# Patient Record
Sex: Female | Born: 1935 | Race: White | Hispanic: No | Smoking: Never smoker
Health system: Southern US, Community
[De-identification: ages and names within clinical notes are randomized; demographics above are authoritative.]

## PROBLEM LIST (undated history)

## (undated) DIAGNOSIS — I10 Essential (primary) hypertension: Secondary | ICD-10-CM

## (undated) HISTORY — PX: CHOLECYSTECTOMY: SHX55

---

## 2012-07-05 DIAGNOSIS — H2510 Age-related nuclear cataract, unspecified eye: Secondary | ICD-10-CM | POA: Diagnosis not present

## 2012-07-05 DIAGNOSIS — H5200 Hypermetropia, unspecified eye: Secondary | ICD-10-CM | POA: Diagnosis not present

## 2012-07-05 DIAGNOSIS — H524 Presbyopia: Secondary | ICD-10-CM | POA: Diagnosis not present

## 2012-07-05 DIAGNOSIS — H52229 Regular astigmatism, unspecified eye: Secondary | ICD-10-CM | POA: Diagnosis not present

## 2012-09-06 DIAGNOSIS — H2510 Age-related nuclear cataract, unspecified eye: Secondary | ICD-10-CM | POA: Diagnosis not present

## 2012-09-29 DIAGNOSIS — Z23 Encounter for immunization: Secondary | ICD-10-CM | POA: Diagnosis not present

## 2012-10-17 DIAGNOSIS — H2510 Age-related nuclear cataract, unspecified eye: Secondary | ICD-10-CM | POA: Diagnosis not present

## 2012-10-17 DIAGNOSIS — H269 Unspecified cataract: Secondary | ICD-10-CM | POA: Diagnosis not present

## 2012-10-18 DIAGNOSIS — H2510 Age-related nuclear cataract, unspecified eye: Secondary | ICD-10-CM | POA: Diagnosis not present

## 2012-11-07 DIAGNOSIS — H269 Unspecified cataract: Secondary | ICD-10-CM | POA: Diagnosis not present

## 2012-11-07 DIAGNOSIS — H524 Presbyopia: Secondary | ICD-10-CM | POA: Diagnosis not present

## 2012-11-07 DIAGNOSIS — H2510 Age-related nuclear cataract, unspecified eye: Secondary | ICD-10-CM | POA: Diagnosis not present

## 2012-11-28 DIAGNOSIS — Z1231 Encounter for screening mammogram for malignant neoplasm of breast: Secondary | ICD-10-CM | POA: Diagnosis not present

## 2012-12-22 DIAGNOSIS — I10 Essential (primary) hypertension: Secondary | ICD-10-CM | POA: Diagnosis not present

## 2012-12-22 DIAGNOSIS — E7800 Pure hypercholesterolemia, unspecified: Secondary | ICD-10-CM | POA: Diagnosis not present

## 2013-02-01 DIAGNOSIS — D1800 Hemangioma unspecified site: Secondary | ICD-10-CM | POA: Diagnosis not present

## 2013-02-01 DIAGNOSIS — L608 Other nail disorders: Secondary | ICD-10-CM | POA: Diagnosis not present

## 2013-02-01 DIAGNOSIS — D485 Neoplasm of uncertain behavior of skin: Secondary | ICD-10-CM | POA: Diagnosis not present

## 2013-02-01 DIAGNOSIS — L820 Inflamed seborrheic keratosis: Secondary | ICD-10-CM | POA: Diagnosis not present

## 2013-02-01 DIAGNOSIS — C44319 Basal cell carcinoma of skin of other parts of face: Secondary | ICD-10-CM | POA: Diagnosis not present

## 2013-02-23 DIAGNOSIS — C44319 Basal cell carcinoma of skin of other parts of face: Secondary | ICD-10-CM | POA: Diagnosis not present

## 2013-06-01 DIAGNOSIS — E7800 Pure hypercholesterolemia, unspecified: Secondary | ICD-10-CM | POA: Diagnosis not present

## 2013-06-01 DIAGNOSIS — I10 Essential (primary) hypertension: Secondary | ICD-10-CM | POA: Diagnosis not present

## 2013-08-23 DIAGNOSIS — Z85828 Personal history of other malignant neoplasm of skin: Secondary | ICD-10-CM | POA: Diagnosis not present

## 2013-08-23 DIAGNOSIS — L570 Actinic keratosis: Secondary | ICD-10-CM | POA: Diagnosis not present

## 2013-10-12 DIAGNOSIS — Z23 Encounter for immunization: Secondary | ICD-10-CM | POA: Diagnosis not present

## 2013-12-01 DIAGNOSIS — I10 Essential (primary) hypertension: Secondary | ICD-10-CM | POA: Diagnosis not present

## 2013-12-01 DIAGNOSIS — E7800 Pure hypercholesterolemia, unspecified: Secondary | ICD-10-CM | POA: Diagnosis not present

## 2013-12-08 DIAGNOSIS — H6120 Impacted cerumen, unspecified ear: Secondary | ICD-10-CM | POA: Diagnosis not present

## 2013-12-08 DIAGNOSIS — Z0000 Encounter for general adult medical examination without abnormal findings: Secondary | ICD-10-CM | POA: Diagnosis not present

## 2013-12-08 DIAGNOSIS — Z23 Encounter for immunization: Secondary | ICD-10-CM | POA: Diagnosis not present

## 2013-12-08 DIAGNOSIS — Z1331 Encounter for screening for depression: Secondary | ICD-10-CM | POA: Diagnosis not present

## 2013-12-08 DIAGNOSIS — E7800 Pure hypercholesterolemia, unspecified: Secondary | ICD-10-CM | POA: Diagnosis not present

## 2013-12-08 DIAGNOSIS — I10 Essential (primary) hypertension: Secondary | ICD-10-CM | POA: Diagnosis not present

## 2014-02-21 DIAGNOSIS — D2330 Other benign neoplasm of skin of unspecified part of face: Secondary | ICD-10-CM | POA: Diagnosis not present

## 2014-02-21 DIAGNOSIS — Z85828 Personal history of other malignant neoplasm of skin: Secondary | ICD-10-CM | POA: Diagnosis not present

## 2014-02-21 DIAGNOSIS — L570 Actinic keratosis: Secondary | ICD-10-CM | POA: Diagnosis not present

## 2014-02-21 DIAGNOSIS — L678 Other hair color and hair shaft abnormalities: Secondary | ICD-10-CM | POA: Diagnosis not present

## 2014-02-21 DIAGNOSIS — L738 Other specified follicular disorders: Secondary | ICD-10-CM | POA: Diagnosis not present

## 2014-02-21 DIAGNOSIS — D485 Neoplasm of uncertain behavior of skin: Secondary | ICD-10-CM | POA: Diagnosis not present

## 2014-03-28 ENCOUNTER — Emergency Department (HOSPITAL_COMMUNITY): Payer: Medicare Other

## 2014-03-28 ENCOUNTER — Emergency Department (HOSPITAL_COMMUNITY)
Admission: EM | Admit: 2014-03-28 | Discharge: 2014-03-28 | Disposition: A | Discharge status: Home / Self Care | Payer: Medicare Other | Attending: Emergency Medicine | Admitting: Emergency Medicine

## 2014-03-28 ENCOUNTER — Encounter (HOSPITAL_COMMUNITY): Payer: Self-pay | Admitting: Emergency Medicine

## 2014-03-28 DIAGNOSIS — S52509A Unspecified fracture of the lower end of unspecified radius, initial encounter for closed fracture: Secondary | ICD-10-CM | POA: Insufficient documentation

## 2014-03-28 DIAGNOSIS — S52599A Other fractures of lower end of unspecified radius, initial encounter for closed fracture: Secondary | ICD-10-CM | POA: Diagnosis not present

## 2014-03-28 DIAGNOSIS — Z91040 Latex allergy status: Secondary | ICD-10-CM | POA: Diagnosis not present

## 2014-03-28 DIAGNOSIS — S52609A Unspecified fracture of lower end of unspecified ulna, initial encounter for closed fracture: Secondary | ICD-10-CM | POA: Diagnosis not present

## 2014-03-28 DIAGNOSIS — Z79899 Other long term (current) drug therapy: Secondary | ICD-10-CM | POA: Insufficient documentation

## 2014-03-28 DIAGNOSIS — I10 Essential (primary) hypertension: Secondary | ICD-10-CM | POA: Diagnosis not present

## 2014-03-28 DIAGNOSIS — Z880 Allergy status to penicillin: Secondary | ICD-10-CM | POA: Diagnosis not present

## 2014-03-28 DIAGNOSIS — R296 Repeated falls: Secondary | ICD-10-CM | POA: Insufficient documentation

## 2014-03-28 DIAGNOSIS — Y9301 Activity, walking, marching and hiking: Secondary | ICD-10-CM | POA: Insufficient documentation

## 2014-03-28 DIAGNOSIS — Y92009 Unspecified place in unspecified non-institutional (private) residence as the place of occurrence of the external cause: Secondary | ICD-10-CM | POA: Insufficient documentation

## 2014-03-28 DIAGNOSIS — S62102A Fracture of unspecified carpal bone, left wrist, initial encounter for closed fracture: Secondary | ICD-10-CM

## 2014-03-28 HISTORY — DX: Essential (primary) hypertension: I10

## 2014-03-28 MED ORDER — TRAMADOL HCL 50 MG PO TABS: 50.0000 mg | ORAL_TABLET | Freq: Four times a day (QID) | ORAL | Status: AC | PRN

## 2014-03-28 NOTE — ED Notes (Signed)
Pt tripped and fell while doing yard work. Pain to left wrist with slight deformity. Occurred just PTA.

## 2014-03-28 NOTE — Discharge Instructions (Signed)
Call Dr. Bertis Ruddy office in the morning and tell them you were evaluated here in the ED for a wrist fracture and will need surgery. Tell them that we spoke with Dr. Burney Gauze and you were to call for an appointment time.   DO NOT DRIVE IF YOU ARE TAKING THE PAIN MEDICATION

## 2014-03-28 NOTE — ED Provider Notes (Signed)
Medical screening examination/treatment/procedure(s) were conducted as a shared visit with non-physician practitioner(s) and myself.  I personally evaluated the patient during the encounter.  X-rays reviewed with patient and family.   Babette Relic, MD 03/30/14 [id number]

## 2014-03-28 NOTE — ED Provider Notes (Signed)
CSN: [id number]     Arrival date & time 03/28/14  1815 History   None    Chief Complaint  Patient presents with  . Wrist Injury     (Consider location/radiation/quality/duration/timing/severity/associated sxs/prior Treatment) Patient is a 80 y.o. female presenting with wrist injury. The history is provided by the patient.  Wrist Injury Location:  Wrist Time since incident:  1 hour Injury: yes   Mechanism of injury: fall   Fall:    Fall occurred:  Walking   Impact surface:  Paediatric nurse of impact:  Hands Wrist location:  L wrist Pain details:    Quality:  Throbbing   Radiates to:  Does not radiate   Severity:  Moderate   Onset quality:  Sudden   Timing:  Constant   Progression:  Worsening Chronicity:  New Handedness:  Right-handed Dislocation: no   Foreign body present:  No foreign bodies Prior injury to area:  No Relieved by:  None tried Worsened by:  Movement Ineffective treatments:  None tried Associated symptoms: decreased range of motion and swelling   Associated symptoms: no numbness and no tingling    Karen Small is a 80 y.o. female who presents to the ED with pain and swelling of the left wrist s/p fall in her yard. She was putting down pine needles and had finished when she tripped and fell. Her weight went on her left hand. She denies any other injuries.   Past Medical History  Diagnosis Date  . Hypertension    Past Surgical History  Procedure Laterality Date  . Cholecystectomy     No family history on file. History  Substance Use Topics  . Smoking status: Never Smoker   . Smokeless tobacco: Not on file  . Alcohol Use: No   OB History   Grav Para Term Preterm Abortions TAB SAB Ect Mult Living                 Review of Systems Negative except as stated in HPI   Allergies  Latex; Neosporin; Niacin and related; Nickel; and Penicillins  Home Medications   Current Outpatient Rx  Name  Route  Sig  Dispense  Refill  . chlorthalidone  (HYGROTON) 25 MG tablet   Oral   Take 25 mg by mouth daily.         Marland Kitchen PRESCRIPTION MEDICATION   Oral   Take 1 tablet by mouth daily. Cholesterol medications (STATIN)          BP 151/90  Pulse 77  Temp(Src) 98.5 F (36.9 C) (Oral)  Resp 20  Ht 5\' 4"  (1.626 m)  Wt 152 lb (68.947 kg)  BMI 26.08 kg/m2  SpO2 100% Physical Exam  Nursing note and vitals reviewed. Constitutional: She is oriented to person, place, and time. She appears well-developed and well-nourished. No distress.  Very active female who appears much younger than stated age.   HENT:  Head: Normocephalic and atraumatic.  Eyes: Conjunctivae and EOM are normal.  Neck: Normal range of motion. Neck supple.  Cardiovascular: Normal rate.   Pulmonary/Chest: Effort normal.  Musculoskeletal:       Left wrist: She exhibits decreased range of motion, tenderness, bony tenderness, swelling and deformity. She exhibits no laceration.  Radial pulse strong, adequate circulation, good touch sensation.   Neurological: She is alert and oriented to person, place, and time. No cranial nerve deficit.  Skin: Skin is warm and dry.  Psychiatric: She has a normal mood and affect. Her  behavior is normal.    ED Course: X-ray reviewed with Dr. Stevie Kern and he examined the patient.  Consult with Dr. Burney Gauze and he will see the patient in the office tomorrow.   Procedures (including critical care time) Labs Review Labs Reviewed - No data to display Imaging Review Dg Wrist Complete Left  03/28/2014   CLINICAL DATA:  Fall, left wrist pain and swelling  EXAM: LEFT WRIST - COMPLETE 3+ VIEW  COMPARISON:  None.  FINDINGS: Comminuted impacted distal radius fracture. Mildly displaced fracture through the ulnar styloid. 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 The scaphoid appears intact. The carpus is congruent.  IMPRESSION: 1. Comminuted and impacted distal radius fracture with articular involvement. 2. Mildly displaced fracture  through the ulnar styloid.   Electronically Signed   By: Jacqulynn Cadet M.D.   On: 03/28/2014 19:31   MDM  80 y.o. female with pain, swelling and mild deformity to the left wrist s/p fall. Placed in sugar tong splint, ice, elevation, sling and follow up with Dr. Burney Gauze tomorrow. Stable for discharge and remains neurovascularly intact. I have reviewed this patient's vital signs, nurses notes, appropriate labs and imaging.  I have discussed findings and plan of care with the patient and she voices understanding. Discussed pain management and patient states she has had Percocet in the past and it was to strong and she wants something mild. States she takes ibuprofen for pain. Will give Rx for Tramadol so if ibuprofen is not enough.   Medication List    TAKE these medications       traMADol 50 MG tablet  Commonly known as:  ULTRAM  Take 1 tablet (50 mg total) by mouth every 6 (six) hours as needed.      ASK your doctor about these medications       chlorthalidone 25 MG tablet  Commonly known as:  HYGROTON  Take 25 mg by mouth daily.     PRESCRIPTION MEDICATION  Take 1 tablet by mouth daily. Cholesterol medications (STATIN)           Ashley Murrain, NP 03/28/14 2029

## 2014-03-29 DIAGNOSIS — S52599A Other fractures of lower end of unspecified radius, initial encounter for closed fracture: Secondary | ICD-10-CM | POA: Diagnosis not present

## 2014-04-02 DIAGNOSIS — Y939 Activity, unspecified: Secondary | ICD-10-CM | POA: Diagnosis not present

## 2014-04-02 DIAGNOSIS — G8918 Other acute postprocedural pain: Secondary | ICD-10-CM | POA: Diagnosis not present

## 2014-04-02 DIAGNOSIS — Y929 Unspecified place or not applicable: Secondary | ICD-10-CM | POA: Diagnosis not present

## 2014-04-02 DIAGNOSIS — G5600 Carpal tunnel syndrome, unspecified upper limb: Secondary | ICD-10-CM | POA: Diagnosis not present

## 2014-04-02 DIAGNOSIS — X58XXXA Exposure to other specified factors, initial encounter: Secondary | ICD-10-CM | POA: Diagnosis not present

## 2014-04-02 DIAGNOSIS — S52599A Other fractures of lower end of unspecified radius, initial encounter for closed fracture: Secondary | ICD-10-CM | POA: Diagnosis not present

## 2014-04-10 DIAGNOSIS — S52599A Other fractures of lower end of unspecified radius, initial encounter for closed fracture: Secondary | ICD-10-CM | POA: Diagnosis not present

## 2014-05-15 DIAGNOSIS — S52599A Other fractures of lower end of unspecified radius, initial encounter for closed fracture: Secondary | ICD-10-CM | POA: Diagnosis not present

## 2014-06-19 DIAGNOSIS — S52599A Other fractures of lower end of unspecified radius, initial encounter for closed fracture: Secondary | ICD-10-CM | POA: Diagnosis not present

## 2014-08-20 DIAGNOSIS — Z85828 Personal history of other malignant neoplasm of skin: Secondary | ICD-10-CM | POA: Diagnosis not present

## 2014-09-11 DIAGNOSIS — M6530 Trigger finger, unspecified finger: Secondary | ICD-10-CM | POA: Diagnosis not present

## 2014-09-11 DIAGNOSIS — S52599A Other fractures of lower end of unspecified radius, initial encounter for closed fracture: Secondary | ICD-10-CM | POA: Diagnosis not present

## 2014-10-02 DIAGNOSIS — M65312 Trigger thumb, left thumb: Secondary | ICD-10-CM | POA: Diagnosis not present

## 2014-10-11 DIAGNOSIS — Z23 Encounter for immunization: Secondary | ICD-10-CM | POA: Diagnosis not present

## 2014-11-01 DIAGNOSIS — M79674 Pain in right toe(s): Secondary | ICD-10-CM | POA: Diagnosis not present

## 2014-11-01 DIAGNOSIS — M2041 Other hammer toe(s) (acquired), right foot: Secondary | ICD-10-CM | POA: Diagnosis not present

## 2014-12-05 DIAGNOSIS — E780 Pure hypercholesterolemia: Secondary | ICD-10-CM | POA: Diagnosis not present

## 2014-12-05 DIAGNOSIS — I10 Essential (primary) hypertension: Secondary | ICD-10-CM | POA: Diagnosis not present

## 2014-12-18 DIAGNOSIS — Z1389 Encounter for screening for other disorder: Secondary | ICD-10-CM | POA: Diagnosis not present

## 2014-12-18 DIAGNOSIS — I10 Essential (primary) hypertension: Secondary | ICD-10-CM | POA: Diagnosis not present

## 2014-12-18 DIAGNOSIS — Z0000 Encounter for general adult medical examination without abnormal findings: Secondary | ICD-10-CM | POA: Diagnosis not present

## 2014-12-18 DIAGNOSIS — E780 Pure hypercholesterolemia: Secondary | ICD-10-CM | POA: Diagnosis not present

## 2015-02-18 DIAGNOSIS — Z85828 Personal history of other malignant neoplasm of skin: Secondary | ICD-10-CM | POA: Diagnosis not present

## 2015-02-18 DIAGNOSIS — L570 Actinic keratosis: Secondary | ICD-10-CM | POA: Diagnosis not present

## 2015-04-04 DIAGNOSIS — E780 Pure hypercholesterolemia: Secondary | ICD-10-CM | POA: Diagnosis not present

## 2015-04-04 DIAGNOSIS — I10 Essential (primary) hypertension: Secondary | ICD-10-CM | POA: Diagnosis not present

## 2015-04-10 DIAGNOSIS — E780 Pure hypercholesterolemia: Secondary | ICD-10-CM | POA: Diagnosis not present

## 2015-04-10 DIAGNOSIS — I10 Essential (primary) hypertension: Secondary | ICD-10-CM | POA: Diagnosis not present

## 2015-08-12 DIAGNOSIS — L570 Actinic keratosis: Secondary | ICD-10-CM | POA: Diagnosis not present

## 2015-08-12 DIAGNOSIS — L259 Unspecified contact dermatitis, unspecified cause: Secondary | ICD-10-CM | POA: Diagnosis not present

## 2015-08-12 DIAGNOSIS — Z85828 Personal history of other malignant neoplasm of skin: Secondary | ICD-10-CM | POA: Diagnosis not present

## 2015-09-24 DIAGNOSIS — I10 Essential (primary) hypertension: Secondary | ICD-10-CM | POA: Diagnosis not present

## 2015-09-24 DIAGNOSIS — E780 Pure hypercholesterolemia: Secondary | ICD-10-CM | POA: Diagnosis not present

## 2015-09-24 DIAGNOSIS — E785 Hyperlipidemia, unspecified: Secondary | ICD-10-CM | POA: Diagnosis not present

## 2015-10-01 DIAGNOSIS — K219 Gastro-esophageal reflux disease without esophagitis: Secondary | ICD-10-CM | POA: Diagnosis not present

## 2015-10-01 DIAGNOSIS — I10 Essential (primary) hypertension: Secondary | ICD-10-CM | POA: Diagnosis not present

## 2015-10-01 DIAGNOSIS — Z23 Encounter for immunization: Secondary | ICD-10-CM | POA: Diagnosis not present

## 2015-10-01 DIAGNOSIS — E782 Mixed hyperlipidemia: Secondary | ICD-10-CM | POA: Diagnosis not present

## 2015-11-21 IMAGING — CR DG WRIST COMPLETE 3+V*L*
2 series · 2 of 2 positions shown · non-contrast
Comparison: None.

CLINICAL DATA: Fall, left wrist pain and swelling

EXAM:
LEFT WRIST - COMPLETE 3+ VIEW

[view not recorded (1 of 2)]
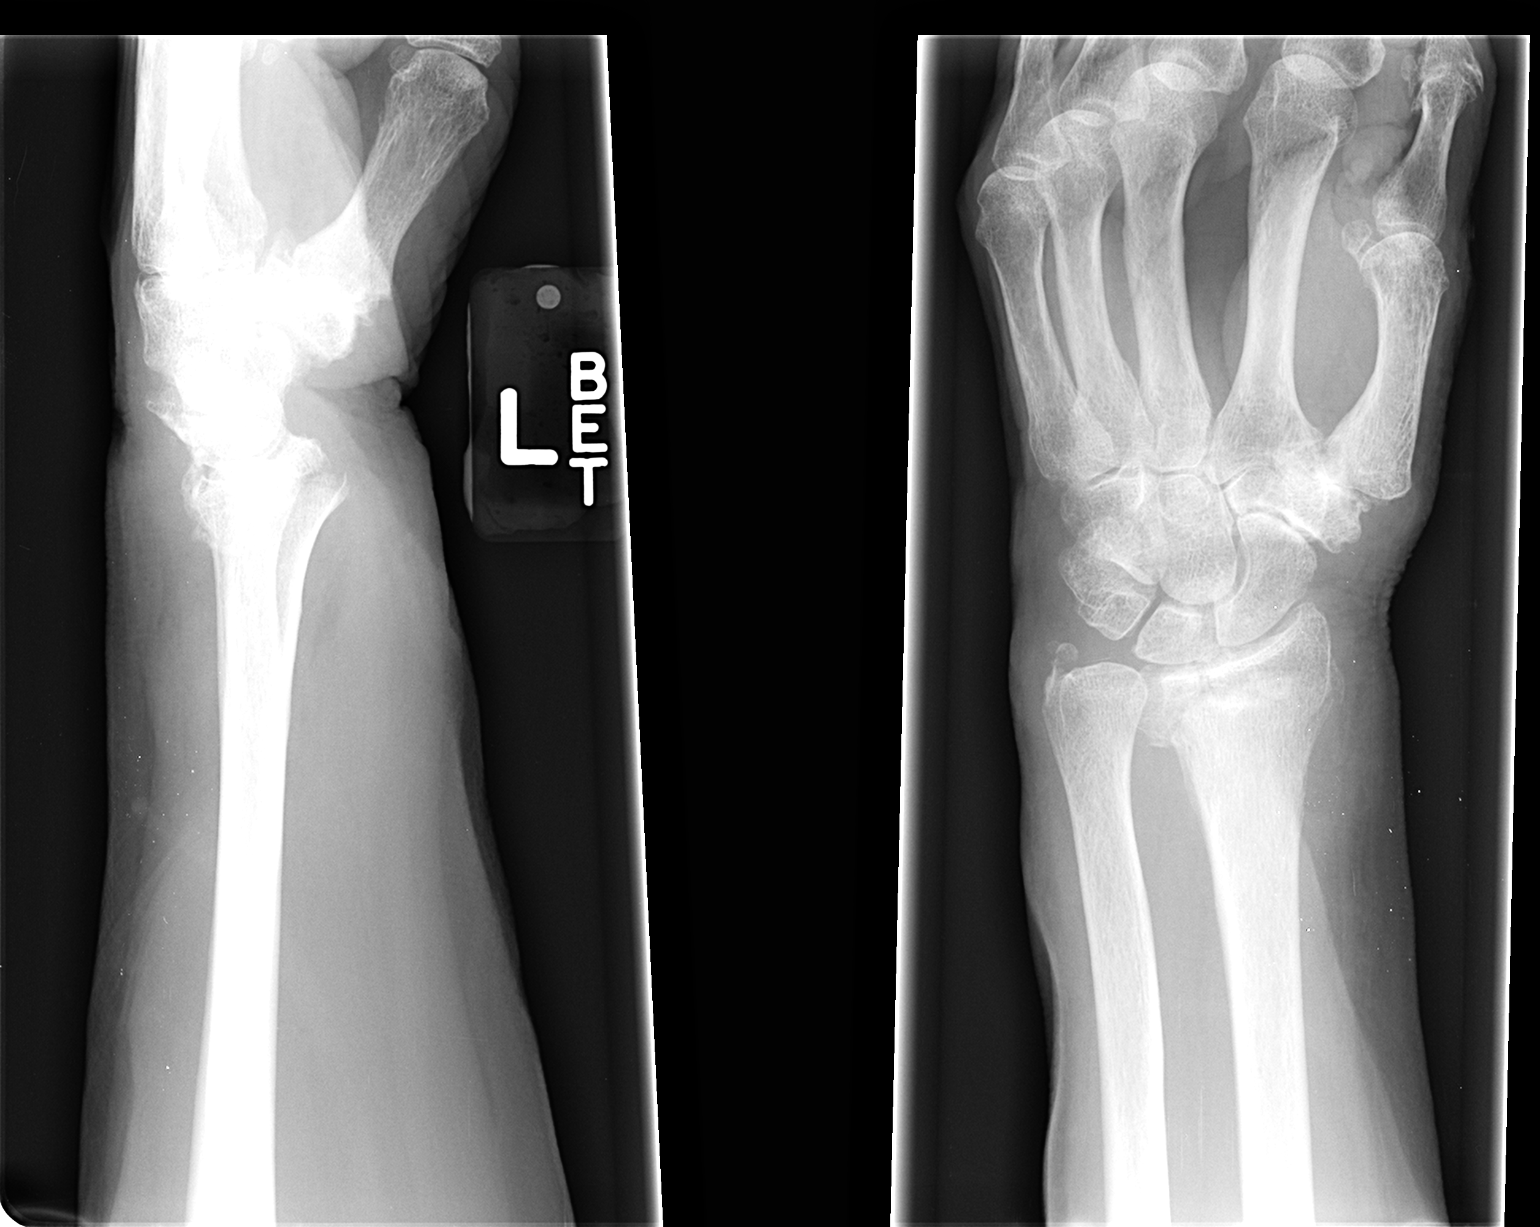

[view not recorded (2 of 2)]
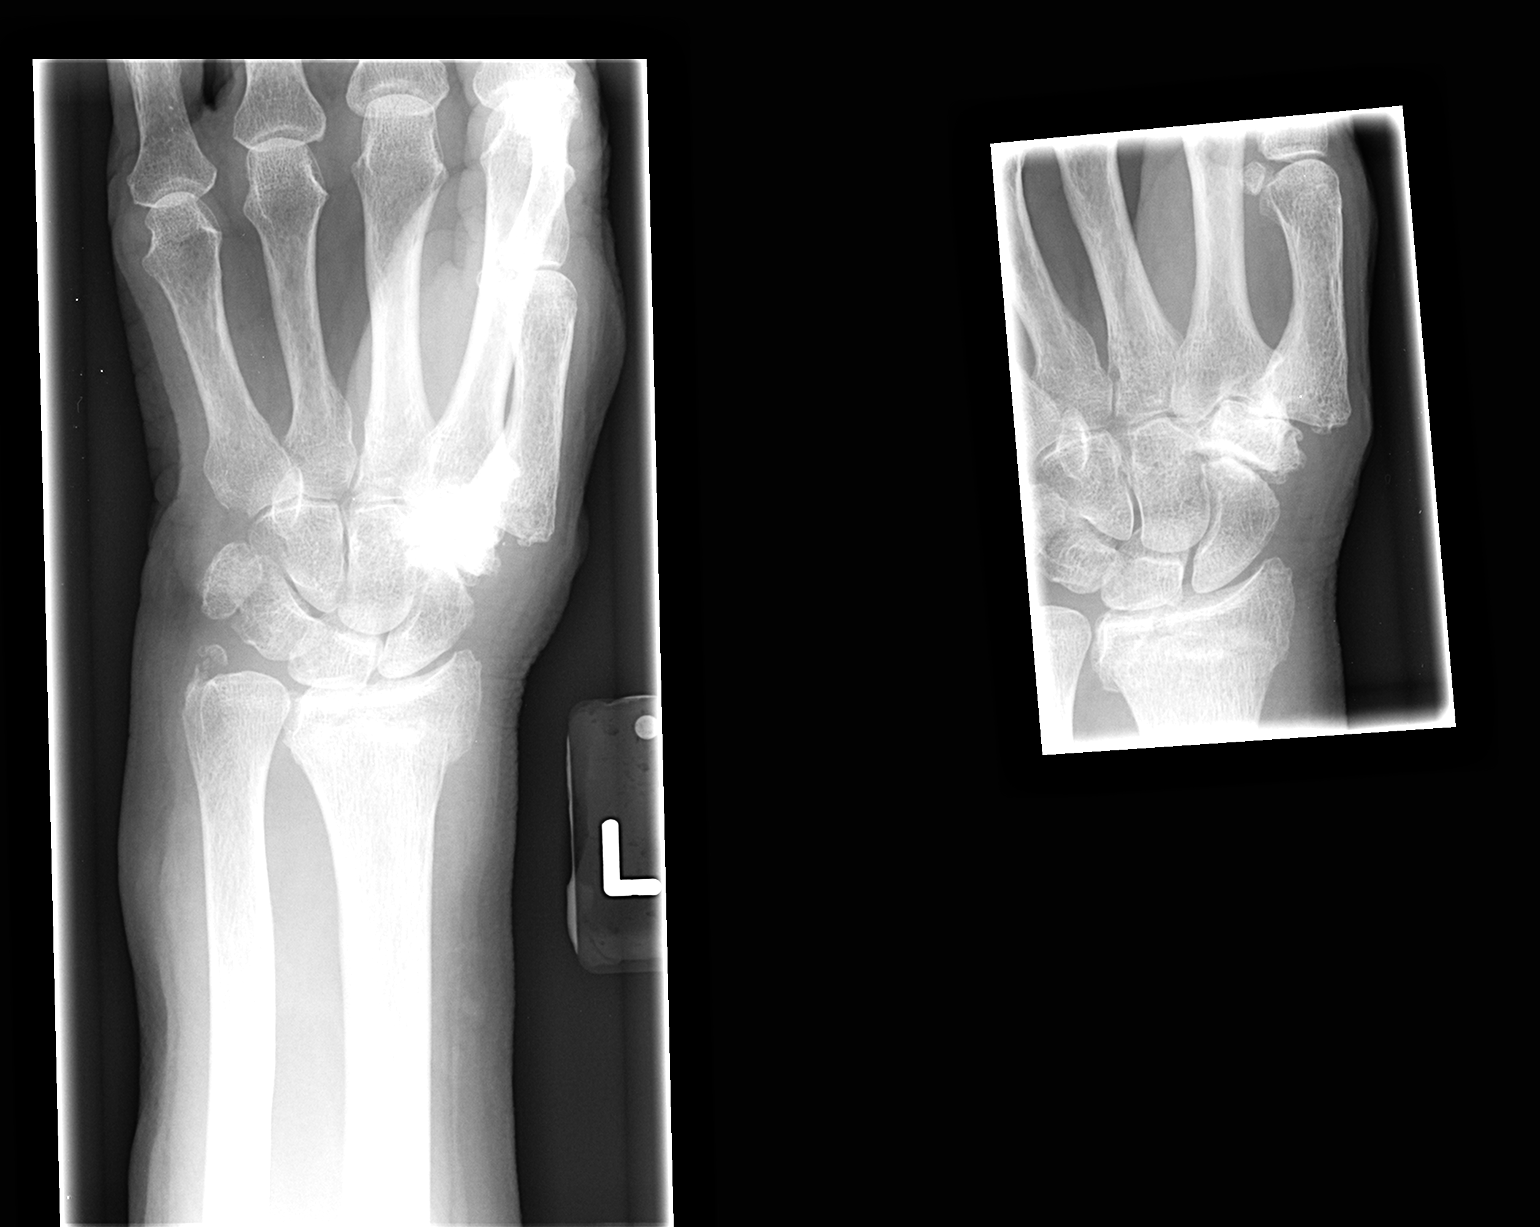

[2 of 2 positions shown; findings below may reference images not displayed]

FINDINGS: Comminuted impacted distal radius fracture. Mildly displaced
fracture through the ulnar styloid. Is degenerative osteoarthritis
at the STT and thumb CMC joints. The scaphoid appears intact. The
carpus is congruent.
IMPRESSION: 1. Comminuted and impacted distal radius fracture with articular
involvement.
2. Mildly displaced fracture through the ulnar styloid.

## 2016-02-17 DIAGNOSIS — L570 Actinic keratosis: Secondary | ICD-10-CM | POA: Diagnosis not present

## 2016-02-17 DIAGNOSIS — Z85828 Personal history of other malignant neoplasm of skin: Secondary | ICD-10-CM | POA: Diagnosis not present

## 2016-03-31 DIAGNOSIS — E782 Mixed hyperlipidemia: Secondary | ICD-10-CM | POA: Diagnosis not present

## 2016-03-31 DIAGNOSIS — E559 Vitamin D deficiency, unspecified: Secondary | ICD-10-CM | POA: Diagnosis not present

## 2016-03-31 DIAGNOSIS — K219 Gastro-esophageal reflux disease without esophagitis: Secondary | ICD-10-CM | POA: Diagnosis not present

## 2016-03-31 DIAGNOSIS — I10 Essential (primary) hypertension: Secondary | ICD-10-CM | POA: Diagnosis not present

## 2016-03-31 DIAGNOSIS — E7800 Pure hypercholesterolemia, unspecified: Secondary | ICD-10-CM | POA: Diagnosis not present

## 2016-04-02 DIAGNOSIS — Z0000 Encounter for general adult medical examination without abnormal findings: Secondary | ICD-10-CM | POA: Diagnosis not present

## 2016-04-02 DIAGNOSIS — I10 Essential (primary) hypertension: Secondary | ICD-10-CM | POA: Diagnosis not present

## 2016-04-02 DIAGNOSIS — Z13220 Encounter for screening for lipoid disorders: Secondary | ICD-10-CM | POA: Diagnosis not present

## 2016-04-02 DIAGNOSIS — K219 Gastro-esophageal reflux disease without esophagitis: Secondary | ICD-10-CM | POA: Diagnosis not present

## 2016-04-09 DIAGNOSIS — M2011 Hallux valgus (acquired), right foot: Secondary | ICD-10-CM | POA: Diagnosis not present

## 2016-10-15 DIAGNOSIS — Z23 Encounter for immunization: Secondary | ICD-10-CM | POA: Diagnosis not present

## 2017-01-25 ENCOUNTER — Emergency Department (HOSPITAL_COMMUNITY)
Admission: EM | Admit: 2017-01-25 | Discharge: 2017-01-25 | Disposition: A | Discharge status: Home / Self Care | Payer: Medicare Other | Attending: Emergency Medicine | Admitting: Emergency Medicine

## 2017-01-25 ENCOUNTER — Encounter (HOSPITAL_COMMUNITY): Payer: Self-pay | Admitting: Emergency Medicine

## 2017-01-25 ENCOUNTER — Emergency Department (HOSPITAL_COMMUNITY): Payer: Medicare Other

## 2017-01-25 DIAGNOSIS — R202 Paresthesia of skin: Secondary | ICD-10-CM | POA: Diagnosis not present

## 2017-01-25 DIAGNOSIS — I10 Essential (primary) hypertension: Secondary | ICD-10-CM | POA: Insufficient documentation

## 2017-01-25 DIAGNOSIS — M19012 Primary osteoarthritis, left shoulder: Secondary | ICD-10-CM | POA: Diagnosis not present

## 2017-01-25 DIAGNOSIS — Z79899 Other long term (current) drug therapy: Secondary | ICD-10-CM | POA: Diagnosis not present

## 2017-01-25 DIAGNOSIS — M25512 Pain in left shoulder: Secondary | ICD-10-CM | POA: Insufficient documentation

## 2017-01-25 LAB — CBC WITH DIFFERENTIAL/PLATELET
Basophils Absolute: 0.1 K/uL (ref 0.0–0.1)
Basophils Relative: 1 %
Eosinophils Absolute: 0.2 K/uL (ref 0.0–0.7)
Eosinophils Relative: 2 %
HCT: 43.9 % (ref 36.0–46.0)
Hemoglobin: 15.2 g/dL — ABNORMAL HIGH (ref 12.0–15.0)
Lymphocytes Relative: 36 %
Lymphs Abs: 2.5 K/uL (ref 0.7–4.0)
MCH: 31 pg (ref 26.0–34.0)
MCHC: 34.6 g/dL (ref 30.0–36.0)
MCV: 89.4 fL (ref 78.0–100.0)
Monocytes Absolute: 0.6 K/uL (ref 0.1–1.0)
Monocytes Relative: 9 %
Neutro Abs: 3.5 K/uL (ref 1.7–7.7)
Neutrophils Relative %: 52 %
Platelets: 271 K/uL (ref 150–400)
RBC: 4.91 MIL/uL (ref 3.87–5.11)
RDW: 12.9 % (ref 11.5–15.5)
WBC: 6.8 K/uL (ref 4.0–10.5)

## 2017-01-25 LAB — TROPONIN I: Troponin I: <0.03 ng/mL (ref <0.03)

## 2017-01-25 LAB — COMPREHENSIVE METABOLIC PANEL WITH GFR
ALT: 34 U/L (ref 14–54)
AST: 37 U/L (ref 15–41)
Albumin: 4.3 g/dL (ref 3.5–5.0)
Alkaline Phosphatase: 92 U/L (ref 38–126)
Anion gap: 10 (ref 5–15)
BUN: 18 mg/dL (ref 6–20)
CO2: 31 mmol/L (ref 22–32)
Calcium: 9.9 mg/dL (ref 8.9–10.3)
Chloride: 97 mmol/L — ABNORMAL LOW (ref 101–111)
Creatinine, Ser: 1.16 mg/dL — ABNORMAL HIGH (ref 0.44–1.00)
GFR calc Af Amer: 49 mL/min — ABNORMAL LOW
GFR calc non Af Amer: 42 mL/min — ABNORMAL LOW
Glucose, Bld: 100 mg/dL — ABNORMAL HIGH (ref 65–99)
Potassium: 3.1 mmol/L — ABNORMAL LOW (ref 3.5–5.1)
Sodium: 138 mmol/L (ref 135–145)
Total Bilirubin: 0.8 mg/dL (ref 0.3–1.2)
Total Protein: 7.7 g/dL (ref 6.5–8.1)

## 2017-01-25 MED ORDER — CYCLOBENZAPRINE HCL 10 MG PO TABS: 5.0000 mg | ORAL_TABLET | Freq: Two times a day (BID) | ORAL | 0 refills | Status: AC | PRN

## 2017-01-25 NOTE — ED Notes (Signed)
ED Provider at bedside. 

## 2017-01-25 NOTE — ED Notes (Signed)
Pt denies pt with out movement

## 2017-01-25 NOTE — ED Triage Notes (Signed)
Pt states she was shoveling snow a few weeks ago. A few days ago. Left shoulder pain started radiating down left arm. Pt stated numbness in elbow and wrist.

## 2017-01-25 NOTE — ED Provider Notes (Signed)
[facility] DEPT Provider Note   CSN: [id number] Arrival date & time: 01/25/17  1136     History   Chief Complaint Chief Complaint  Patient presents with  . Shoulder Pain    HPI Karen Small is a 83 y.o. female.  HPI  83 year old female resents today complaining of left shoulder pain for 8 days. She states that she shoveled snow on a Friday and began having pain in her left shoulder on Sunday. The pain is relieved with Aleve at night but is severe after she gets up in the morning. She has noticed some numbness in her arm. She points to the left scapula for the pain. She denies any pain in her neck. She denies any weakness in her hand. She has had no previous problems in this area. She denies any other problems today.  Past Medical History:  Diagnosis Date  . Hypertension     There are no active problems to display for this patient.   Past Surgical History:  Procedure Laterality Date  . CHOLECYSTECTOMY      OB History    No data available       Home Medications    Prior to Admission medications   Medication Sig Start Date End Date Taking? Authorizing Provider  chlorthalidone (HYGROTON) 25 MG tablet Take 25 mg by mouth daily. 02/23/14   Historical Provider, MD  cyclobenzaprine (FLEXERIL) 10 MG tablet Take 0.5 tablets (5 mg total) by mouth 2 (two) times daily as needed for muscle spasms. 01/25/17   Pattricia Boss, MD  PRESCRIPTION MEDICATION Take 1 tablet by mouth daily. Cholesterol medications (STATIN)    Historical Provider, MD  traMADol (ULTRAM) 50 MG tablet Take 1 tablet (50 mg total) by mouth every 6 (six) hours as needed. 03/28/14   [place], NP    Family History History reviewed. No pertinent family history.  Social History Social History  Substance Use Topics  . Smoking status: Never Smoker  . Smokeless tobacco: Never Used  . Alcohol use No     Allergies   Latex; Neosporin [neomycin-bacitracin zn-polymyx]; Niacin and related; Nickel; and  Penicillins   Review of Systems Review of Systems  All other systems reviewed and are negative.    Physical Exam Updated Vital Signs BP (!) 154/101 (BP Location: Right Arm)   Pulse 72   Temp 97.4 F (36.3 C) (Oral)   Resp 18   Ht 5\' 4"  (1.626 m)   Wt 68.9 kg   SpO2 93%   BMI 26.09 kg/m   Physical Exam  Constitutional: She is oriented to person, place, and time. She appears well-developed and well-nourished. No distress.  HENT:  Head: Normocephalic and atraumatic.  Right Ear: External ear normal.  Left Ear: External ear normal.  Nose: Nose normal.  Eyes: Conjunctivae and EOM are normal. Pupils are equal, round, and reactive to light.  Neck: Normal range of motion. Neck supple.  Pulmonary/Chest: Effort normal.  Musculoskeletal: Normal range of motion.  Neurological: She is alert and oriented to person, place, and time. She exhibits normal muscle tone. Coordination normal.  Tenderness over her left scapula. No crepitus noted. Full active range of left shoulder. Radial pulse intact. Skin appears normal. Hand and elbow appear normal. Sensation intact throughout left upper extremity.  Skin: Skin is warm and dry.  Psychiatric: She has a normal mood and affect. Her behavior is normal. Thought content normal.  Nursing note and vitals reviewed.    ED Treatments / Results  Labs (  all labs ordered are listed, but only abnormal results are displayed) Labs Reviewed  CBC WITH DIFFERENTIAL/PLATELET - Abnormal; Notable for the following:       Result Value   Hemoglobin 15.2 (*)    All other components within normal limits  COMPREHENSIVE METABOLIC PANEL - Abnormal; Notable for the following:    Potassium 3.1 (*)    Chloride 97 (*)    Glucose, Bld 100 (*)    Creatinine, Ser 1.16 (*)    GFR calc non Af Amer 42 (*)    GFR calc Af Amer 49 (*)    All other components within normal limits  TROPONIN I    EKG  EKG Interpretation  Date/Time:  Monday January 25 2017 11:53:35  EST Ventricular Rate:  75 PR Interval:  178 QRS Duration: 78 QT Interval:  388 QTC Calculation: 433 R Axis:   26 Text Interpretation:  Normal sinus rhythm Normal ECG Confirmed by Tallulah Hosman MD, Andee Poles [id number]) on 01/25/2017 4:29:03 PM       Radiology Dg Shoulder Left  Result Date: 01/25/2017 CLINICAL DATA:  83 year old female with left shoulder and arm pain for the last 10 days EXAM: LEFT SHOULDER - 2+ VIEW COMPARISON:  None FINDINGS: Atherosclerotic calcifications noted in the aorta. The visualized lungs are clear. No evidence of acute fracture or malalignment involving the shoulder. The humeral head is located. Minimal degenerative change present at the acromioclavicular joint. IMPRESSION: 1. No acute osseous abnormality involving the left shoulder. 2. Minimal degenerative change at the acromioclavicular joint. 3.  Aortic Atherosclerosis (ICD10-170.0) Electronically Signed   By: Jacqulynn Cadet M.D.   On: 01/25/2017 12:49    Procedures Procedures (including critical care time)  Medications Ordered in ED Medications - No data to display   Initial Impression / Assessment and Plan / ED Course  I have reviewed the triage vital signs and the nursing notes.  Pertinent labs & imaging results that were available during my care of the patient were reviewed by me and considered in my medical decision making (see chart for details).     83 year old female With left shoulder pain for the past week. This occurred after exertion. She complains of paresthesias and left upper extremity but is normal on neurological exam. There is some tenderness over the left scapula. X-rays are normal. Pain could be originating in neck. Patient referred to orthopedist for follow-up. She is given Flexeril 5 mg for pain.  Final Clinical Impressions(s) / ED Diagnoses   Final diagnoses:  Acute pain of left shoulder  Paresthesia of left upper extremity    New Prescriptions New Prescriptions   CYCLOBENZAPRINE  (FLEXERIL) 10 MG TABLET    Take 0.5 tablets (5 mg total) by mouth 2 (two) times daily as needed for muscle spasms.     Pattricia Boss, MD 01/25/17 [id number]

## 2017-01-25 NOTE — ED Notes (Signed)
Patient given discharge instruction, verbalized understand. Patient ambulatory out of the department.  

## 2017-04-05 DIAGNOSIS — I10 Essential (primary) hypertension: Secondary | ICD-10-CM | POA: Diagnosis not present

## 2017-04-05 DIAGNOSIS — Z6827 Body mass index (BMI) 27.0-27.9, adult: Secondary | ICD-10-CM | POA: Diagnosis not present

## 2017-04-05 DIAGNOSIS — Z0000 Encounter for general adult medical examination without abnormal findings: Secondary | ICD-10-CM | POA: Diagnosis not present

## 2017-04-05 DIAGNOSIS — K219 Gastro-esophageal reflux disease without esophagitis: Secondary | ICD-10-CM | POA: Diagnosis not present

## 2017-04-05 DIAGNOSIS — E7800 Pure hypercholesterolemia, unspecified: Secondary | ICD-10-CM | POA: Diagnosis not present

## 2017-04-05 DIAGNOSIS — E782 Mixed hyperlipidemia: Secondary | ICD-10-CM | POA: Diagnosis not present

## 2017-04-15 DIAGNOSIS — E7800 Pure hypercholesterolemia, unspecified: Secondary | ICD-10-CM | POA: Diagnosis not present

## 2017-04-15 DIAGNOSIS — Z0000 Encounter for general adult medical examination without abnormal findings: Secondary | ICD-10-CM | POA: Diagnosis not present

## 2017-04-15 DIAGNOSIS — I10 Essential (primary) hypertension: Secondary | ICD-10-CM | POA: Diagnosis not present

## 2017-04-15 DIAGNOSIS — Z6828 Body mass index (BMI) 28.0-28.9, adult: Secondary | ICD-10-CM | POA: Diagnosis not present

## 2017-10-18 DIAGNOSIS — Z23 Encounter for immunization: Secondary | ICD-10-CM | POA: Diagnosis not present

## 2018-04-13 DIAGNOSIS — K219 Gastro-esophageal reflux disease without esophagitis: Secondary | ICD-10-CM | POA: Diagnosis not present

## 2018-04-13 DIAGNOSIS — I10 Essential (primary) hypertension: Secondary | ICD-10-CM | POA: Diagnosis not present

## 2018-04-13 DIAGNOSIS — E7800 Pure hypercholesterolemia, unspecified: Secondary | ICD-10-CM | POA: Diagnosis not present

## 2018-04-13 DIAGNOSIS — E782 Mixed hyperlipidemia: Secondary | ICD-10-CM | POA: Diagnosis not present

## 2018-04-20 DIAGNOSIS — Z0000 Encounter for general adult medical examination without abnormal findings: Secondary | ICD-10-CM | POA: Diagnosis not present

## 2018-04-20 DIAGNOSIS — I10 Essential (primary) hypertension: Secondary | ICD-10-CM | POA: Diagnosis not present

## 2018-04-20 DIAGNOSIS — Z6828 Body mass index (BMI) 28.0-28.9, adult: Secondary | ICD-10-CM | POA: Diagnosis not present

## 2018-04-20 DIAGNOSIS — E7800 Pure hypercholesterolemia, unspecified: Secondary | ICD-10-CM | POA: Diagnosis not present

## 2018-04-20 DIAGNOSIS — K219 Gastro-esophageal reflux disease without esophagitis: Secondary | ICD-10-CM | POA: Diagnosis not present

## 2018-07-26 DIAGNOSIS — H6123 Impacted cerumen, bilateral: Secondary | ICD-10-CM | POA: Diagnosis not present

## 2018-07-26 DIAGNOSIS — Z6828 Body mass index (BMI) 28.0-28.9, adult: Secondary | ICD-10-CM | POA: Diagnosis not present

## 2018-09-20 IMAGING — DX DG SHOULDER 2+V*L*
3 series · 3 of 3 positions shown · non-contrast
Comparison: None

CLINICAL DATA: 85-year-old female with left shoulder and arm pain
for the last 10 days

EXAM:
LEFT SHOULDER - 2+ VIEW

[shoulder ap]
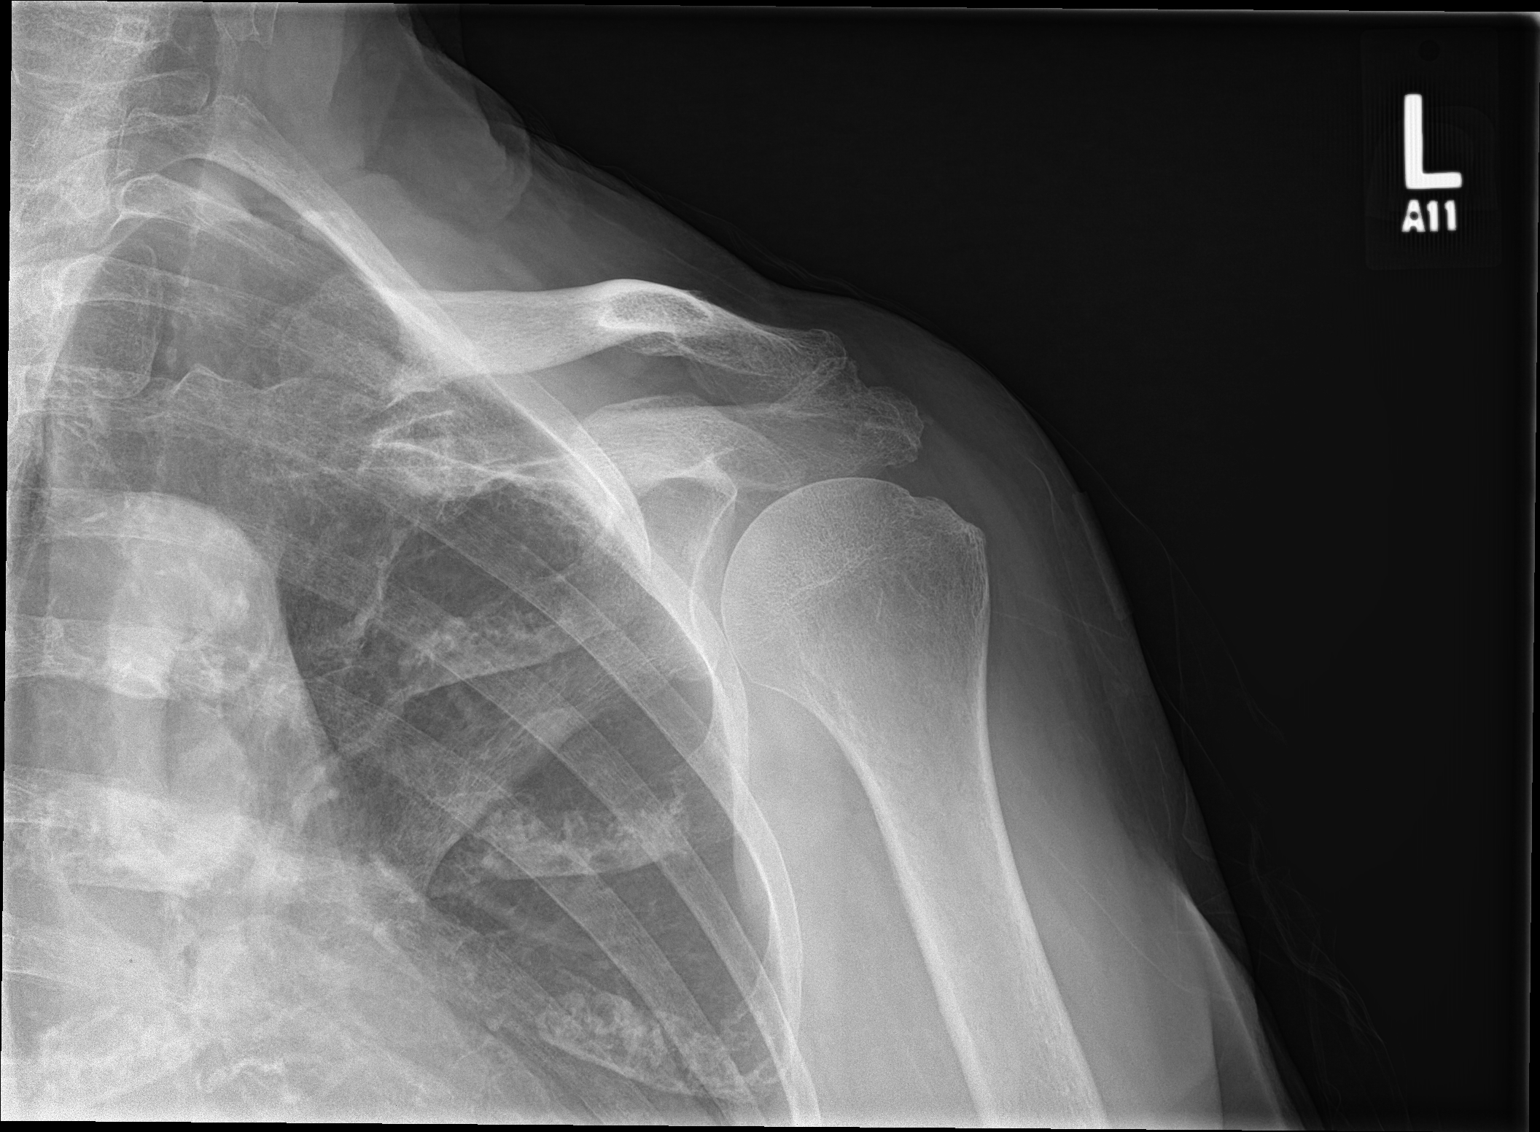

[shoulder y view]
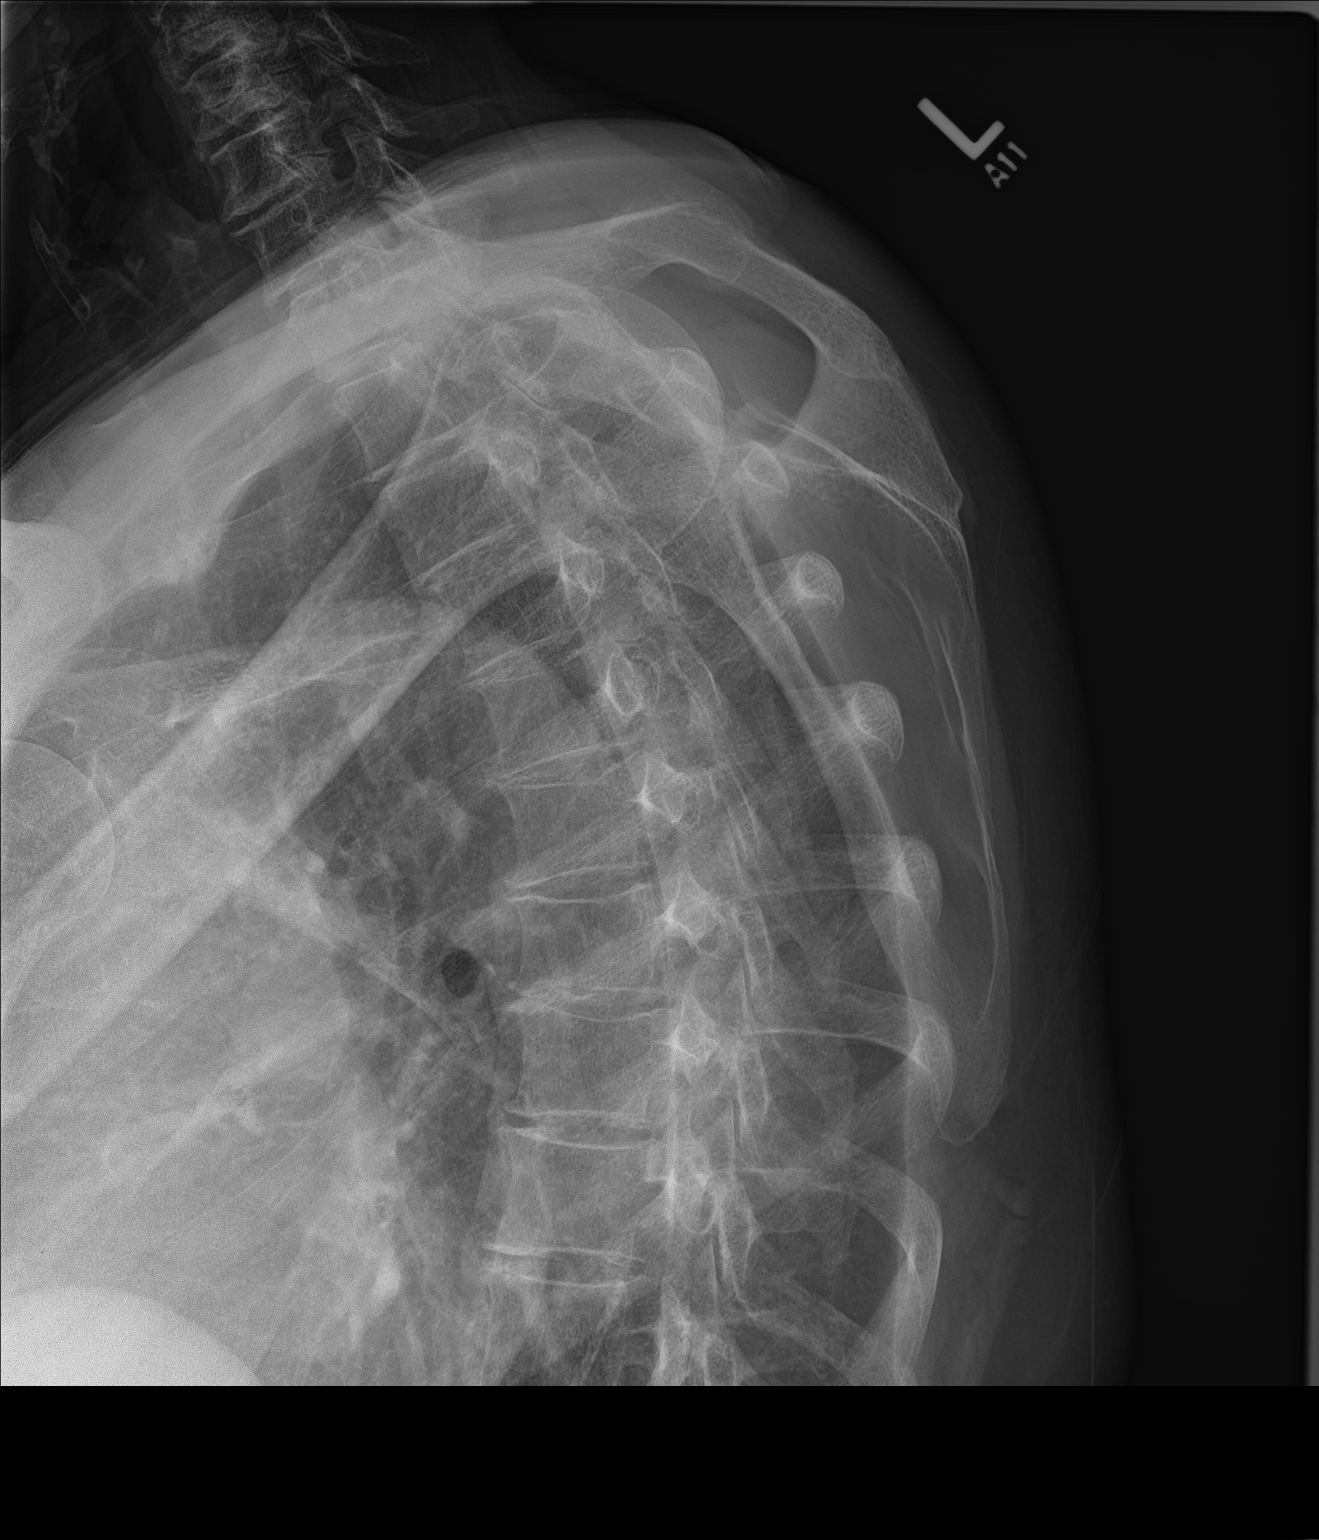

[shoulder axillary]
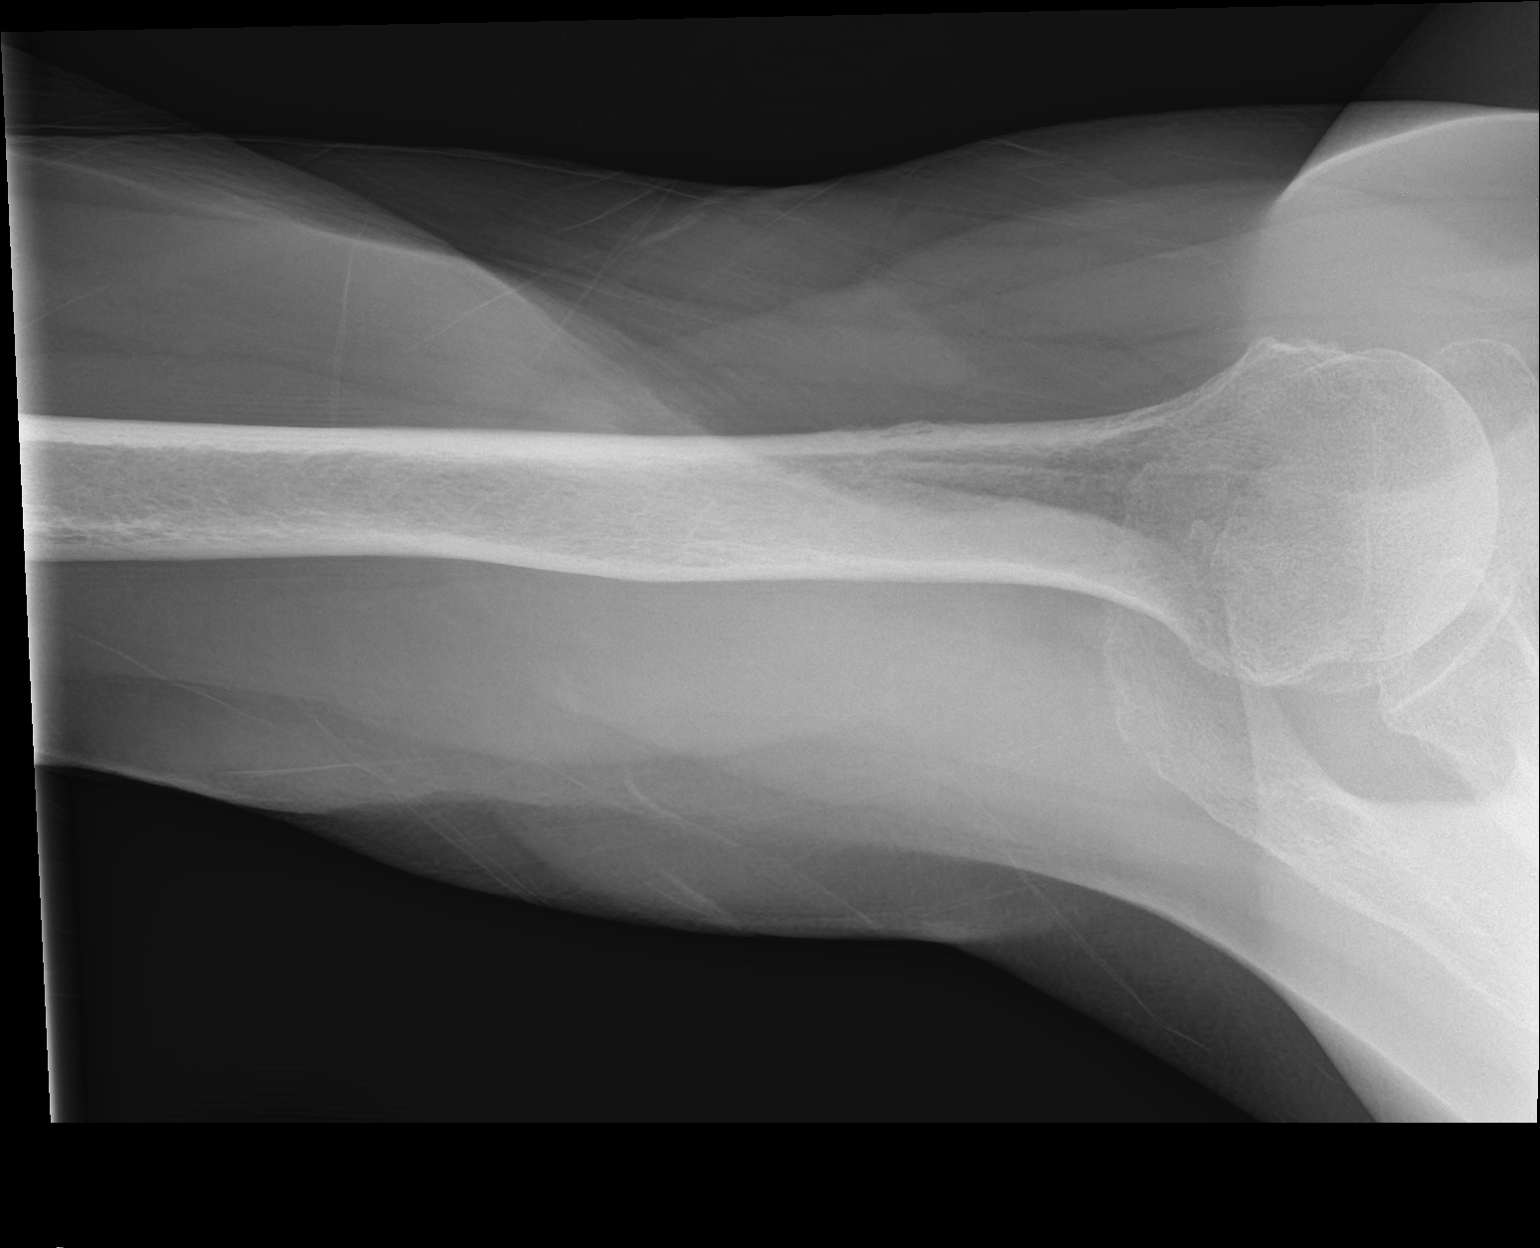

[3 of 3 positions shown; findings below may reference images not displayed]

FINDINGS: Atherosclerotic calcifications noted in the aorta. The visualized
lungs are clear. No evidence of acute fracture or malalignment
involving the shoulder. The humeral head is located. Minimal
degenerative change present at the acromioclavicular joint.
IMPRESSION: 1. No acute osseous abnormality involving the left shoulder.
2. Minimal degenerative change at the acromioclavicular joint.
3.  Aortic Atherosclerosis ([id number]-170.0)

## 2018-10-19 DIAGNOSIS — Z23 Encounter for immunization: Secondary | ICD-10-CM | POA: Diagnosis not present

## 2019-04-27 DIAGNOSIS — E7800 Pure hypercholesterolemia, unspecified: Secondary | ICD-10-CM | POA: Diagnosis not present

## 2019-04-27 DIAGNOSIS — K219 Gastro-esophageal reflux disease without esophagitis: Secondary | ICD-10-CM | POA: Diagnosis not present

## 2019-04-27 DIAGNOSIS — I10 Essential (primary) hypertension: Secondary | ICD-10-CM | POA: Diagnosis not present

## 2019-04-27 DIAGNOSIS — E782 Mixed hyperlipidemia: Secondary | ICD-10-CM | POA: Diagnosis not present

## 2019-05-02 DIAGNOSIS — I10 Essential (primary) hypertension: Secondary | ICD-10-CM | POA: Diagnosis not present

## 2019-05-02 DIAGNOSIS — E782 Mixed hyperlipidemia: Secondary | ICD-10-CM | POA: Diagnosis not present

## 2019-05-02 DIAGNOSIS — Z0000 Encounter for general adult medical examination without abnormal findings: Secondary | ICD-10-CM | POA: Diagnosis not present

## 2019-05-02 DIAGNOSIS — Z6828 Body mass index (BMI) 28.0-28.9, adult: Secondary | ICD-10-CM | POA: Diagnosis not present

## 2019-10-10 DIAGNOSIS — Z23 Encounter for immunization: Secondary | ICD-10-CM | POA: Diagnosis not present

## 2020-01-11 DIAGNOSIS — Z23 Encounter for immunization: Secondary | ICD-10-CM | POA: Diagnosis not present

## 2020-02-10 DIAGNOSIS — Z23 Encounter for immunization: Secondary | ICD-10-CM | POA: Diagnosis not present

## 2020-05-14 DIAGNOSIS — I10 Essential (primary) hypertension: Secondary | ICD-10-CM | POA: Diagnosis not present

## 2020-05-14 DIAGNOSIS — K219 Gastro-esophageal reflux disease without esophagitis: Secondary | ICD-10-CM | POA: Diagnosis not present

## 2020-05-14 DIAGNOSIS — E7800 Pure hypercholesterolemia, unspecified: Secondary | ICD-10-CM | POA: Diagnosis not present

## 2020-05-14 DIAGNOSIS — E782 Mixed hyperlipidemia: Secondary | ICD-10-CM | POA: Diagnosis not present

## 2020-05-17 DIAGNOSIS — I358 Other nonrheumatic aortic valve disorders: Secondary | ICD-10-CM | POA: Diagnosis not present

## 2020-05-17 DIAGNOSIS — R7989 Other specified abnormal findings of blood chemistry: Secondary | ICD-10-CM | POA: Diagnosis not present

## 2020-05-17 DIAGNOSIS — E782 Mixed hyperlipidemia: Secondary | ICD-10-CM | POA: Diagnosis not present

## 2020-05-17 DIAGNOSIS — Z0001 Encounter for general adult medical examination with abnormal findings: Secondary | ICD-10-CM | POA: Diagnosis not present

## 2020-05-17 DIAGNOSIS — Z6828 Body mass index (BMI) 28.0-28.9, adult: Secondary | ICD-10-CM | POA: Diagnosis not present

## 2020-05-17 DIAGNOSIS — I10 Essential (primary) hypertension: Secondary | ICD-10-CM | POA: Diagnosis not present

## 2020-06-03 DIAGNOSIS — Z9049 Acquired absence of other specified parts of digestive tract: Secondary | ICD-10-CM | POA: Diagnosis not present

## 2020-06-03 DIAGNOSIS — I10 Essential (primary) hypertension: Secondary | ICD-10-CM | POA: Diagnosis not present

## 2020-06-03 DIAGNOSIS — R011 Cardiac murmur, unspecified: Secondary | ICD-10-CM | POA: Diagnosis not present

## 2020-06-03 DIAGNOSIS — R748 Abnormal levels of other serum enzymes: Secondary | ICD-10-CM | POA: Diagnosis not present

## 2020-06-03 DIAGNOSIS — R7989 Other specified abnormal findings of blood chemistry: Secondary | ICD-10-CM | POA: Diagnosis not present

## 2020-06-03 DIAGNOSIS — I082 Rheumatic disorders of both aortic and tricuspid valves: Secondary | ICD-10-CM | POA: Diagnosis not present

## 2020-08-20 DIAGNOSIS — I358 Other nonrheumatic aortic valve disorders: Secondary | ICD-10-CM | POA: Diagnosis not present

## 2020-08-20 DIAGNOSIS — R7989 Other specified abnormal findings of blood chemistry: Secondary | ICD-10-CM | POA: Diagnosis not present

## 2020-08-20 DIAGNOSIS — Z6829 Body mass index (BMI) 29.0-29.9, adult: Secondary | ICD-10-CM | POA: Diagnosis not present

## 2020-08-21 DIAGNOSIS — Z23 Encounter for immunization: Secondary | ICD-10-CM | POA: Diagnosis not present

## 2020-12-12 DIAGNOSIS — Z23 Encounter for immunization: Secondary | ICD-10-CM | POA: Diagnosis not present

## 2021-05-16 DIAGNOSIS — E7800 Pure hypercholesterolemia, unspecified: Secondary | ICD-10-CM | POA: Diagnosis not present

## 2021-05-16 DIAGNOSIS — E7849 Other hyperlipidemia: Secondary | ICD-10-CM | POA: Diagnosis not present

## 2021-05-16 DIAGNOSIS — R7989 Other specified abnormal findings of blood chemistry: Secondary | ICD-10-CM | POA: Diagnosis not present

## 2021-05-16 DIAGNOSIS — E782 Mixed hyperlipidemia: Secondary | ICD-10-CM | POA: Diagnosis not present

## 2021-05-16 DIAGNOSIS — I10 Essential (primary) hypertension: Secondary | ICD-10-CM | POA: Diagnosis not present

## 2021-05-16 DIAGNOSIS — E7801 Familial hypercholesterolemia: Secondary | ICD-10-CM | POA: Diagnosis not present

## 2021-05-20 DIAGNOSIS — I83813 Varicose veins of bilateral lower extremities with pain: Secondary | ICD-10-CM | POA: Diagnosis not present

## 2021-05-20 DIAGNOSIS — R32 Unspecified urinary incontinence: Secondary | ICD-10-CM | POA: Diagnosis not present

## 2021-05-20 DIAGNOSIS — Z7409 Other reduced mobility: Secondary | ICD-10-CM | POA: Diagnosis not present

## 2021-05-20 DIAGNOSIS — M6284 Sarcopenia: Secondary | ICD-10-CM | POA: Diagnosis not present

## 2021-05-20 DIAGNOSIS — Z6828 Body mass index (BMI) 28.0-28.9, adult: Secondary | ICD-10-CM | POA: Diagnosis not present

## 2021-05-20 DIAGNOSIS — Z0001 Encounter for general adult medical examination with abnormal findings: Secondary | ICD-10-CM | POA: Diagnosis not present

## 2021-05-20 DIAGNOSIS — I10 Essential (primary) hypertension: Secondary | ICD-10-CM | POA: Diagnosis not present

## 2021-11-05 DIAGNOSIS — Z23 Encounter for immunization: Secondary | ICD-10-CM | POA: Diagnosis not present

## 2022-07-30 DIAGNOSIS — R739 Hyperglycemia, unspecified: Secondary | ICD-10-CM | POA: Diagnosis not present

## 2022-07-30 DIAGNOSIS — Z6828 Body mass index (BMI) 28.0-28.9, adult: Secondary | ICD-10-CM | POA: Diagnosis not present

## 2022-07-30 DIAGNOSIS — I358 Other nonrheumatic aortic valve disorders: Secondary | ICD-10-CM | POA: Diagnosis not present

## 2022-07-30 DIAGNOSIS — Z7409 Other reduced mobility: Secondary | ICD-10-CM | POA: Diagnosis not present

## 2022-07-30 DIAGNOSIS — M6284 Sarcopenia: Secondary | ICD-10-CM | POA: Diagnosis not present

## 2022-07-30 DIAGNOSIS — I83813 Varicose veins of bilateral lower extremities with pain: Secondary | ICD-10-CM | POA: Diagnosis not present

## 2022-07-30 DIAGNOSIS — I10 Essential (primary) hypertension: Secondary | ICD-10-CM | POA: Diagnosis not present

## 2023-01-01 DIAGNOSIS — Z23 Encounter for immunization: Secondary | ICD-10-CM | POA: Diagnosis not present

## 2023-01-29 DIAGNOSIS — Z7409 Other reduced mobility: Secondary | ICD-10-CM | POA: Diagnosis not present

## 2023-01-29 DIAGNOSIS — E7849 Other hyperlipidemia: Secondary | ICD-10-CM | POA: Diagnosis not present

## 2023-01-29 DIAGNOSIS — Z1389 Encounter for screening for other disorder: Secondary | ICD-10-CM | POA: Diagnosis not present

## 2023-01-29 DIAGNOSIS — Z6827 Body mass index (BMI) 27.0-27.9, adult: Secondary | ICD-10-CM | POA: Diagnosis not present

## 2023-01-29 DIAGNOSIS — I10 Essential (primary) hypertension: Secondary | ICD-10-CM | POA: Diagnosis not present

## 2023-01-29 DIAGNOSIS — Z1331 Encounter for screening for depression: Secondary | ICD-10-CM | POA: Diagnosis not present

## 2023-01-29 DIAGNOSIS — M6284 Sarcopenia: Secondary | ICD-10-CM | POA: Diagnosis not present

## 2023-01-29 DIAGNOSIS — I83813 Varicose veins of bilateral lower extremities with pain: Secondary | ICD-10-CM | POA: Diagnosis not present

## 2023-01-29 DIAGNOSIS — I358 Other nonrheumatic aortic valve disorders: Secondary | ICD-10-CM | POA: Diagnosis not present

## 2023-01-29 DIAGNOSIS — E119 Type 2 diabetes mellitus without complications: Secondary | ICD-10-CM | POA: Diagnosis not present

## 2023-02-08 DIAGNOSIS — R011 Cardiac murmur, unspecified: Secondary | ICD-10-CM | POA: Diagnosis not present

## 2023-02-08 DIAGNOSIS — I083 Combined rheumatic disorders of mitral, aortic and tricuspid valves: Secondary | ICD-10-CM | POA: Diagnosis not present

## 2023-02-23 DIAGNOSIS — M8589 Other specified disorders of bone density and structure, multiple sites: Secondary | ICD-10-CM | POA: Diagnosis not present

## 2023-02-23 DIAGNOSIS — M810 Age-related osteoporosis without current pathological fracture: Secondary | ICD-10-CM | POA: Diagnosis not present

## 2023-08-04 DIAGNOSIS — M6284 Sarcopenia: Secondary | ICD-10-CM | POA: Diagnosis not present

## 2023-08-04 DIAGNOSIS — I358 Other nonrheumatic aortic valve disorders: Secondary | ICD-10-CM | POA: Diagnosis not present

## 2023-08-04 DIAGNOSIS — Z1321 Encounter for screening for nutritional disorder: Secondary | ICD-10-CM | POA: Diagnosis not present

## 2023-08-04 DIAGNOSIS — E7849 Other hyperlipidemia: Secondary | ICD-10-CM | POA: Diagnosis not present

## 2023-08-04 DIAGNOSIS — Z7409 Other reduced mobility: Secondary | ICD-10-CM | POA: Diagnosis not present

## 2023-08-04 DIAGNOSIS — Z1389 Encounter for screening for other disorder: Secondary | ICD-10-CM | POA: Diagnosis not present

## 2023-08-04 DIAGNOSIS — Z23 Encounter for immunization: Secondary | ICD-10-CM | POA: Diagnosis not present

## 2023-08-04 DIAGNOSIS — E782 Mixed hyperlipidemia: Secondary | ICD-10-CM | POA: Diagnosis not present

## 2023-08-04 DIAGNOSIS — Z0001 Encounter for general adult medical examination with abnormal findings: Secondary | ICD-10-CM | POA: Diagnosis not present

## 2023-08-04 DIAGNOSIS — I83813 Varicose veins of bilateral lower extremities with pain: Secondary | ICD-10-CM | POA: Diagnosis not present

## 2023-08-04 DIAGNOSIS — R5383 Other fatigue: Secondary | ICD-10-CM | POA: Diagnosis not present

## 2023-08-04 DIAGNOSIS — I10 Essential (primary) hypertension: Secondary | ICD-10-CM | POA: Diagnosis not present

## 2023-08-04 DIAGNOSIS — Z1331 Encounter for screening for depression: Secondary | ICD-10-CM | POA: Diagnosis not present

## 2023-08-04 DIAGNOSIS — Z6826 Body mass index (BMI) 26.0-26.9, adult: Secondary | ICD-10-CM | POA: Diagnosis not present

## 2023-11-11 DIAGNOSIS — E7849 Other hyperlipidemia: Secondary | ICD-10-CM | POA: Diagnosis not present

## 2023-12-29 DIAGNOSIS — R6520 Severe sepsis without septic shock: Secondary | ICD-10-CM | POA: Diagnosis present

## 2023-12-29 DIAGNOSIS — R0902 Hypoxemia: Secondary | ICD-10-CM | POA: Diagnosis not present

## 2023-12-29 DIAGNOSIS — T796XXD Traumatic ischemia of muscle, subsequent encounter: Secondary | ICD-10-CM | POA: Diagnosis not present

## 2023-12-29 DIAGNOSIS — Z91048 Other nonmedicinal substance allergy status: Secondary | ICD-10-CM | POA: Diagnosis not present

## 2023-12-29 DIAGNOSIS — N390 Urinary tract infection, site not specified: Secondary | ICD-10-CM | POA: Diagnosis present

## 2023-12-29 DIAGNOSIS — T796XXA Traumatic ischemia of muscle, initial encounter: Secondary | ICD-10-CM | POA: Diagnosis not present

## 2023-12-29 DIAGNOSIS — R000 Tachycardia, unspecified: Secondary | ICD-10-CM | POA: Diagnosis not present

## 2023-12-29 DIAGNOSIS — B9620 Unspecified Escherichia coli [E. coli] as the cause of diseases classified elsewhere: Secondary | ICD-10-CM | POA: Diagnosis not present

## 2023-12-29 DIAGNOSIS — Z91040 Latex allergy status: Secondary | ICD-10-CM | POA: Diagnosis not present

## 2023-12-29 DIAGNOSIS — R4182 Altered mental status, unspecified: Secondary | ICD-10-CM | POA: Diagnosis present

## 2023-12-29 DIAGNOSIS — A4189 Other specified sepsis: Secondary | ICD-10-CM | POA: Diagnosis present

## 2023-12-29 DIAGNOSIS — R7881 Bacteremia: Secondary | ICD-10-CM | POA: Diagnosis not present

## 2023-12-29 DIAGNOSIS — Z87891 Personal history of nicotine dependence: Secondary | ICD-10-CM | POA: Diagnosis not present

## 2023-12-29 DIAGNOSIS — E876 Hypokalemia: Secondary | ICD-10-CM | POA: Diagnosis present

## 2023-12-29 DIAGNOSIS — Z66 Do not resuscitate: Secondary | ICD-10-CM | POA: Diagnosis not present

## 2023-12-29 DIAGNOSIS — Z880 Allergy status to penicillin: Secondary | ICD-10-CM | POA: Diagnosis not present

## 2023-12-29 DIAGNOSIS — Z79899 Other long term (current) drug therapy: Secondary | ICD-10-CM | POA: Diagnosis not present

## 2023-12-29 DIAGNOSIS — G9341 Metabolic encephalopathy: Secondary | ICD-10-CM | POA: Diagnosis present

## 2023-12-29 DIAGNOSIS — Z888 Allergy status to other drugs, medicaments and biological substances status: Secondary | ICD-10-CM | POA: Diagnosis not present

## 2023-12-29 DIAGNOSIS — W19XXXA Unspecified fall, initial encounter: Secondary | ICD-10-CM | POA: Diagnosis not present

## 2023-12-29 DIAGNOSIS — I700 Atherosclerosis of aorta: Secondary | ICD-10-CM | POA: Diagnosis not present

## 2023-12-29 DIAGNOSIS — Z602 Problems related to living alone: Secondary | ICD-10-CM | POA: Diagnosis present

## 2023-12-29 DIAGNOSIS — R410 Disorientation, unspecified: Secondary | ICD-10-CM | POA: Diagnosis not present

## 2023-12-29 DIAGNOSIS — B9689 Other specified bacterial agents as the cause of diseases classified elsewhere: Secondary | ICD-10-CM | POA: Diagnosis not present

## 2023-12-29 DIAGNOSIS — I10 Essential (primary) hypertension: Secondary | ICD-10-CM | POA: Diagnosis not present

## 2023-12-29 DIAGNOSIS — Z881 Allergy status to other antibiotic agents status: Secondary | ICD-10-CM | POA: Diagnosis not present

## 2023-12-29 DIAGNOSIS — R918 Other nonspecific abnormal finding of lung field: Secondary | ICD-10-CM | POA: Diagnosis not present

## 2023-12-29 DIAGNOSIS — Z792 Long term (current) use of antibiotics: Secondary | ICD-10-CM | POA: Diagnosis not present

## 2023-12-29 DIAGNOSIS — I499 Cardiac arrhythmia, unspecified: Secondary | ICD-10-CM | POA: Diagnosis not present

## 2023-12-29 DIAGNOSIS — Z7901 Long term (current) use of anticoagulants: Secondary | ICD-10-CM | POA: Diagnosis not present

## 2023-12-29 DIAGNOSIS — U071 COVID-19: Secondary | ICD-10-CM | POA: Diagnosis present

## 2023-12-29 DIAGNOSIS — M160 Bilateral primary osteoarthritis of hip: Secondary | ICD-10-CM | POA: Diagnosis not present

## 2023-12-29 DIAGNOSIS — M47816 Spondylosis without myelopathy or radiculopathy, lumbar region: Secondary | ICD-10-CM | POA: Diagnosis not present

## 2023-12-29 DIAGNOSIS — R54 Age-related physical debility: Secondary | ICD-10-CM | POA: Diagnosis present

## 2023-12-29 DIAGNOSIS — M6282 Rhabdomyolysis: Secondary | ICD-10-CM | POA: Diagnosis present

## 2023-12-30 DIAGNOSIS — B9620 Unspecified Escherichia coli [E. coli] as the cause of diseases classified elsewhere: Secondary | ICD-10-CM | POA: Diagnosis present

## 2023-12-30 DIAGNOSIS — Z87891 Personal history of nicotine dependence: Secondary | ICD-10-CM | POA: Diagnosis not present

## 2023-12-30 DIAGNOSIS — N390 Urinary tract infection, site not specified: Secondary | ICD-10-CM | POA: Diagnosis present

## 2023-12-30 DIAGNOSIS — Z91040 Latex allergy status: Secondary | ICD-10-CM | POA: Diagnosis not present

## 2023-12-30 DIAGNOSIS — Z792 Long term (current) use of antibiotics: Secondary | ICD-10-CM | POA: Diagnosis not present

## 2023-12-30 DIAGNOSIS — I10 Essential (primary) hypertension: Secondary | ICD-10-CM | POA: Diagnosis not present

## 2023-12-30 DIAGNOSIS — Z880 Allergy status to penicillin: Secondary | ICD-10-CM | POA: Diagnosis not present

## 2023-12-30 DIAGNOSIS — A4189 Other specified sepsis: Secondary | ICD-10-CM | POA: Diagnosis present

## 2023-12-30 DIAGNOSIS — T796XXD Traumatic ischemia of muscle, subsequent encounter: Secondary | ICD-10-CM | POA: Diagnosis not present

## 2023-12-30 DIAGNOSIS — E876 Hypokalemia: Secondary | ICD-10-CM | POA: Diagnosis present

## 2023-12-30 DIAGNOSIS — Z79899 Other long term (current) drug therapy: Secondary | ICD-10-CM | POA: Diagnosis not present

## 2023-12-30 DIAGNOSIS — Z602 Problems related to living alone: Secondary | ICD-10-CM | POA: Diagnosis present

## 2023-12-30 DIAGNOSIS — M6282 Rhabdomyolysis: Secondary | ICD-10-CM | POA: Diagnosis present

## 2023-12-30 DIAGNOSIS — R6520 Severe sepsis without septic shock: Secondary | ICD-10-CM | POA: Diagnosis present

## 2023-12-30 DIAGNOSIS — R54 Age-related physical debility: Secondary | ICD-10-CM | POA: Diagnosis present

## 2023-12-30 DIAGNOSIS — W19XXXA Unspecified fall, initial encounter: Secondary | ICD-10-CM | POA: Diagnosis not present

## 2023-12-30 DIAGNOSIS — T796XXA Traumatic ischemia of muscle, initial encounter: Secondary | ICD-10-CM | POA: Diagnosis not present

## 2023-12-30 DIAGNOSIS — R7881 Bacteremia: Secondary | ICD-10-CM | POA: Diagnosis not present

## 2023-12-30 DIAGNOSIS — Z881 Allergy status to other antibiotic agents status: Secondary | ICD-10-CM | POA: Diagnosis not present

## 2023-12-30 DIAGNOSIS — Z66 Do not resuscitate: Secondary | ICD-10-CM | POA: Diagnosis not present

## 2023-12-30 DIAGNOSIS — Z888 Allergy status to other drugs, medicaments and biological substances status: Secondary | ICD-10-CM | POA: Diagnosis not present

## 2023-12-30 DIAGNOSIS — U071 COVID-19: Secondary | ICD-10-CM | POA: Diagnosis present

## 2023-12-30 DIAGNOSIS — B9689 Other specified bacterial agents as the cause of diseases classified elsewhere: Secondary | ICD-10-CM | POA: Diagnosis not present

## 2023-12-30 DIAGNOSIS — Z91048 Other nonmedicinal substance allergy status: Secondary | ICD-10-CM | POA: Diagnosis not present

## 2023-12-30 DIAGNOSIS — R4182 Altered mental status, unspecified: Secondary | ICD-10-CM | POA: Diagnosis present

## 2023-12-30 DIAGNOSIS — G9341 Metabolic encephalopathy: Secondary | ICD-10-CM | POA: Diagnosis present

## 2023-12-30 DIAGNOSIS — Z7901 Long term (current) use of anticoagulants: Secondary | ICD-10-CM | POA: Diagnosis not present

## 2024-01-05 DIAGNOSIS — I959 Hypotension, unspecified: Secondary | ICD-10-CM | POA: Diagnosis not present

## 2024-01-05 DIAGNOSIS — R41841 Cognitive communication deficit: Secondary | ICD-10-CM | POA: Diagnosis not present

## 2024-01-05 DIAGNOSIS — R262 Difficulty in walking, not elsewhere classified: Secondary | ICD-10-CM | POA: Diagnosis not present

## 2024-01-05 DIAGNOSIS — Z87891 Personal history of nicotine dependence: Secondary | ICD-10-CM | POA: Diagnosis not present

## 2024-01-05 DIAGNOSIS — Z91040 Latex allergy status: Secondary | ICD-10-CM | POA: Diagnosis not present

## 2024-01-05 DIAGNOSIS — Z7901 Long term (current) use of anticoagulants: Secondary | ICD-10-CM | POA: Diagnosis not present

## 2024-01-05 DIAGNOSIS — W1839XA Other fall on same level, initial encounter: Secondary | ICD-10-CM | POA: Diagnosis not present

## 2024-01-05 DIAGNOSIS — E785 Hyperlipidemia, unspecified: Secondary | ICD-10-CM | POA: Diagnosis not present

## 2024-01-05 DIAGNOSIS — F0390 Unspecified dementia without behavioral disturbance: Secondary | ICD-10-CM | POA: Diagnosis not present

## 2024-01-05 DIAGNOSIS — R54 Age-related physical debility: Secondary | ICD-10-CM | POA: Diagnosis not present

## 2024-01-05 DIAGNOSIS — Z880 Allergy status to penicillin: Secondary | ICD-10-CM | POA: Diagnosis not present

## 2024-01-05 DIAGNOSIS — U071 COVID-19: Secondary | ICD-10-CM | POA: Diagnosis not present

## 2024-01-05 DIAGNOSIS — R531 Weakness: Secondary | ICD-10-CM | POA: Diagnosis not present

## 2024-01-05 DIAGNOSIS — I10 Essential (primary) hypertension: Secondary | ICD-10-CM | POA: Diagnosis not present

## 2024-01-05 DIAGNOSIS — M6281 Muscle weakness (generalized): Secondary | ICD-10-CM | POA: Diagnosis not present

## 2024-01-05 DIAGNOSIS — R2689 Other abnormalities of gait and mobility: Secondary | ICD-10-CM | POA: Diagnosis not present

## 2024-01-05 DIAGNOSIS — Z79899 Other long term (current) drug therapy: Secondary | ICD-10-CM | POA: Diagnosis not present

## 2024-01-05 DIAGNOSIS — Z66 Do not resuscitate: Secondary | ICD-10-CM | POA: Diagnosis not present

## 2024-01-10 DIAGNOSIS — I10 Essential (primary) hypertension: Secondary | ICD-10-CM | POA: Diagnosis not present

## 2024-01-10 DIAGNOSIS — Z79899 Other long term (current) drug therapy: Secondary | ICD-10-CM | POA: Diagnosis not present

## 2024-01-10 DIAGNOSIS — Z9181 History of falling: Secondary | ICD-10-CM | POA: Diagnosis not present

## 2024-01-10 DIAGNOSIS — M6281 Muscle weakness (generalized): Secondary | ICD-10-CM | POA: Diagnosis not present

## 2024-01-10 DIAGNOSIS — F0390 Unspecified dementia without behavioral disturbance: Secondary | ICD-10-CM | POA: Diagnosis not present

## 2024-01-10 DIAGNOSIS — U071 COVID-19: Secondary | ICD-10-CM | POA: Diagnosis not present

## 2024-01-10 DIAGNOSIS — R2681 Unsteadiness on feet: Secondary | ICD-10-CM | POA: Diagnosis not present

## 2024-01-10 DIAGNOSIS — R2689 Other abnormalities of gait and mobility: Secondary | ICD-10-CM | POA: Diagnosis not present

## 2024-01-10 DIAGNOSIS — R5381 Other malaise: Secondary | ICD-10-CM | POA: Diagnosis not present

## 2024-01-10 DIAGNOSIS — E46 Unspecified protein-calorie malnutrition: Secondary | ICD-10-CM | POA: Diagnosis not present

## 2024-01-10 DIAGNOSIS — R41841 Cognitive communication deficit: Secondary | ICD-10-CM | POA: Diagnosis not present

## 2024-01-10 DIAGNOSIS — R531 Weakness: Secondary | ICD-10-CM | POA: Diagnosis not present

## 2024-01-10 DIAGNOSIS — E785 Hyperlipidemia, unspecified: Secondary | ICD-10-CM | POA: Diagnosis not present

## 2024-01-10 DIAGNOSIS — R296 Repeated falls: Secondary | ICD-10-CM | POA: Diagnosis not present

## 2024-01-13 DIAGNOSIS — F0390 Unspecified dementia without behavioral disturbance: Secondary | ICD-10-CM | POA: Diagnosis not present

## 2024-01-13 DIAGNOSIS — R296 Repeated falls: Secondary | ICD-10-CM | POA: Diagnosis not present

## 2024-01-13 DIAGNOSIS — R5381 Other malaise: Secondary | ICD-10-CM | POA: Diagnosis not present

## 2024-01-13 DIAGNOSIS — I10 Essential (primary) hypertension: Secondary | ICD-10-CM | POA: Diagnosis not present

## 2024-01-14 DIAGNOSIS — U071 COVID-19: Secondary | ICD-10-CM | POA: Diagnosis not present

## 2024-01-14 DIAGNOSIS — Z5986 Financial insecurity: Secondary | ICD-10-CM | POA: Diagnosis not present

## 2024-01-14 DIAGNOSIS — N390 Urinary tract infection, site not specified: Secondary | ICD-10-CM | POA: Diagnosis not present

## 2024-01-14 DIAGNOSIS — I959 Hypotension, unspecified: Secondary | ICD-10-CM | POA: Diagnosis not present

## 2024-01-14 DIAGNOSIS — Z880 Allergy status to penicillin: Secondary | ICD-10-CM | POA: Diagnosis not present

## 2024-01-14 DIAGNOSIS — R41840 Attention and concentration deficit: Secondary | ICD-10-CM | POA: Diagnosis not present

## 2024-01-14 DIAGNOSIS — R627 Adult failure to thrive: Secondary | ICD-10-CM | POA: Diagnosis present

## 2024-01-14 DIAGNOSIS — R799 Abnormal finding of blood chemistry, unspecified: Secondary | ICD-10-CM | POA: Diagnosis not present

## 2024-01-14 DIAGNOSIS — Z5982 Transportation insecurity: Secondary | ICD-10-CM | POA: Diagnosis not present

## 2024-01-14 DIAGNOSIS — J1008 Influenza due to other identified influenza virus with other specified pneumonia: Secondary | ICD-10-CM | POA: Diagnosis present

## 2024-01-14 DIAGNOSIS — G9341 Metabolic encephalopathy: Secondary | ICD-10-CM | POA: Diagnosis present

## 2024-01-14 DIAGNOSIS — R000 Tachycardia, unspecified: Secondary | ICD-10-CM | POA: Diagnosis not present

## 2024-01-14 DIAGNOSIS — E785 Hyperlipidemia, unspecified: Secondary | ICD-10-CM | POA: Diagnosis not present

## 2024-01-14 DIAGNOSIS — R2689 Other abnormalities of gait and mobility: Secondary | ICD-10-CM | POA: Diagnosis not present

## 2024-01-14 DIAGNOSIS — Z91040 Latex allergy status: Secondary | ICD-10-CM | POA: Diagnosis not present

## 2024-01-14 DIAGNOSIS — R059 Cough, unspecified: Secondary | ICD-10-CM | POA: Diagnosis not present

## 2024-01-14 DIAGNOSIS — R2681 Unsteadiness on feet: Secondary | ICD-10-CM | POA: Diagnosis not present

## 2024-01-14 DIAGNOSIS — R4182 Altered mental status, unspecified: Secondary | ICD-10-CM | POA: Diagnosis not present

## 2024-01-14 DIAGNOSIS — M6281 Muscle weakness (generalized): Secondary | ICD-10-CM | POA: Diagnosis not present

## 2024-01-14 DIAGNOSIS — R41841 Cognitive communication deficit: Secondary | ICD-10-CM | POA: Diagnosis not present

## 2024-01-14 DIAGNOSIS — R5383 Other fatigue: Secondary | ICD-10-CM | POA: Diagnosis not present

## 2024-01-14 DIAGNOSIS — F0390 Unspecified dementia without behavioral disturbance: Secondary | ICD-10-CM | POA: Diagnosis not present

## 2024-01-14 DIAGNOSIS — J019 Acute sinusitis, unspecified: Secondary | ICD-10-CM | POA: Diagnosis present

## 2024-01-14 DIAGNOSIS — R5381 Other malaise: Secondary | ICD-10-CM | POA: Diagnosis not present

## 2024-01-14 DIAGNOSIS — R296 Repeated falls: Secondary | ICD-10-CM | POA: Diagnosis present

## 2024-01-14 DIAGNOSIS — Z8616 Personal history of COVID-19: Secondary | ICD-10-CM | POA: Diagnosis not present

## 2024-01-14 DIAGNOSIS — E876 Hypokalemia: Secondary | ICD-10-CM | POA: Diagnosis not present

## 2024-01-14 DIAGNOSIS — E8809 Other disorders of plasma-protein metabolism, not elsewhere classified: Secondary | ICD-10-CM | POA: Diagnosis present

## 2024-01-14 DIAGNOSIS — Z9181 History of falling: Secondary | ICD-10-CM | POA: Diagnosis not present

## 2024-01-14 DIAGNOSIS — R1312 Dysphagia, oropharyngeal phase: Secondary | ICD-10-CM | POA: Diagnosis not present

## 2024-01-14 DIAGNOSIS — R509 Fever, unspecified: Secondary | ICD-10-CM | POA: Diagnosis not present

## 2024-01-14 DIAGNOSIS — Z66 Do not resuscitate: Secondary | ICD-10-CM | POA: Diagnosis present

## 2024-01-14 DIAGNOSIS — R531 Weakness: Secondary | ICD-10-CM | POA: Diagnosis not present

## 2024-01-14 DIAGNOSIS — N179 Acute kidney failure, unspecified: Secondary | ICD-10-CM | POA: Diagnosis present

## 2024-01-14 DIAGNOSIS — F03C0 Unspecified dementia, severe, without behavioral disturbance, psychotic disturbance, mood disturbance, and anxiety: Secondary | ICD-10-CM | POA: Diagnosis present

## 2024-01-14 DIAGNOSIS — I10 Essential (primary) hypertension: Secondary | ICD-10-CM | POA: Diagnosis not present

## 2024-01-14 DIAGNOSIS — E46 Unspecified protein-calorie malnutrition: Secondary | ICD-10-CM | POA: Diagnosis not present

## 2024-01-14 DIAGNOSIS — Z8673 Personal history of transient ischemic attack (TIA), and cerebral infarction without residual deficits: Secondary | ICD-10-CM | POA: Diagnosis not present

## 2024-01-17 DIAGNOSIS — R2689 Other abnormalities of gait and mobility: Secondary | ICD-10-CM | POA: Diagnosis not present

## 2024-01-17 DIAGNOSIS — R41840 Attention and concentration deficit: Secondary | ICD-10-CM | POA: Diagnosis not present

## 2024-01-17 DIAGNOSIS — R41841 Cognitive communication deficit: Secondary | ICD-10-CM | POA: Diagnosis not present

## 2024-01-17 DIAGNOSIS — I10 Essential (primary) hypertension: Secondary | ICD-10-CM | POA: Diagnosis not present

## 2024-01-17 DIAGNOSIS — E785 Hyperlipidemia, unspecified: Secondary | ICD-10-CM | POA: Diagnosis not present

## 2024-01-21 DIAGNOSIS — E876 Hypokalemia: Secondary | ICD-10-CM | POA: Diagnosis not present

## 2024-01-21 DIAGNOSIS — R799 Abnormal finding of blood chemistry, unspecified: Secondary | ICD-10-CM | POA: Diagnosis not present

## 2024-01-25 DIAGNOSIS — R5383 Other fatigue: Secondary | ICD-10-CM | POA: Diagnosis not present

## 2024-01-25 DIAGNOSIS — R531 Weakness: Secondary | ICD-10-CM | POA: Diagnosis not present

## 2024-01-26 DIAGNOSIS — I10 Essential (primary) hypertension: Secondary | ICD-10-CM | POA: Diagnosis present

## 2024-01-26 DIAGNOSIS — R627 Adult failure to thrive: Secondary | ICD-10-CM | POA: Diagnosis present

## 2024-01-26 DIAGNOSIS — G9341 Metabolic encephalopathy: Secondary | ICD-10-CM | POA: Diagnosis present

## 2024-01-26 DIAGNOSIS — J159 Unspecified bacterial pneumonia: Secondary | ICD-10-CM | POA: Diagnosis not present

## 2024-01-26 DIAGNOSIS — J09X1 Influenza due to identified novel influenza A virus with pneumonia: Secondary | ICD-10-CM | POA: Diagnosis not present

## 2024-01-26 DIAGNOSIS — Z743 Need for continuous supervision: Secondary | ICD-10-CM | POA: Diagnosis not present

## 2024-01-26 DIAGNOSIS — Z91040 Latex allergy status: Secondary | ICD-10-CM | POA: Diagnosis not present

## 2024-01-26 DIAGNOSIS — Z5982 Transportation insecurity: Secondary | ICD-10-CM | POA: Diagnosis not present

## 2024-01-26 DIAGNOSIS — R059 Cough, unspecified: Secondary | ICD-10-CM | POA: Diagnosis not present

## 2024-01-26 DIAGNOSIS — R509 Fever, unspecified: Secondary | ICD-10-CM | POA: Diagnosis not present

## 2024-01-26 DIAGNOSIS — Z8673 Personal history of transient ischemic attack (TIA), and cerebral infarction without residual deficits: Secondary | ICD-10-CM | POA: Diagnosis not present

## 2024-01-26 DIAGNOSIS — J1008 Influenza due to other identified influenza virus with other specified pneumonia: Secondary | ICD-10-CM | POA: Diagnosis present

## 2024-01-26 DIAGNOSIS — R4182 Altered mental status, unspecified: Secondary | ICD-10-CM | POA: Diagnosis not present

## 2024-01-26 DIAGNOSIS — R41841 Cognitive communication deficit: Secondary | ICD-10-CM | POA: Diagnosis not present

## 2024-01-26 DIAGNOSIS — Z8616 Personal history of COVID-19: Secondary | ICD-10-CM | POA: Diagnosis not present

## 2024-01-26 DIAGNOSIS — E785 Hyperlipidemia, unspecified: Secondary | ICD-10-CM | POA: Diagnosis present

## 2024-01-26 DIAGNOSIS — Z880 Allergy status to penicillin: Secondary | ICD-10-CM | POA: Diagnosis not present

## 2024-01-26 DIAGNOSIS — U071 COVID-19: Secondary | ICD-10-CM | POA: Diagnosis not present

## 2024-01-26 DIAGNOSIS — J111 Influenza due to unidentified influenza virus with other respiratory manifestations: Secondary | ICD-10-CM | POA: Diagnosis not present

## 2024-01-26 DIAGNOSIS — M6281 Muscle weakness (generalized): Secondary | ICD-10-CM | POA: Diagnosis not present

## 2024-01-26 DIAGNOSIS — R2689 Other abnormalities of gait and mobility: Secondary | ICD-10-CM | POA: Diagnosis not present

## 2024-01-26 DIAGNOSIS — J019 Acute sinusitis, unspecified: Secondary | ICD-10-CM | POA: Diagnosis present

## 2024-01-26 DIAGNOSIS — J189 Pneumonia, unspecified organism: Secondary | ICD-10-CM | POA: Diagnosis not present

## 2024-01-26 DIAGNOSIS — N390 Urinary tract infection, site not specified: Secondary | ICD-10-CM | POA: Diagnosis not present

## 2024-01-26 DIAGNOSIS — Z66 Do not resuscitate: Secondary | ICD-10-CM | POA: Diagnosis present

## 2024-01-26 DIAGNOSIS — F03C0 Unspecified dementia, severe, without behavioral disturbance, psychotic disturbance, mood disturbance, and anxiety: Secondary | ICD-10-CM | POA: Diagnosis present

## 2024-01-26 DIAGNOSIS — R2681 Unsteadiness on feet: Secondary | ICD-10-CM | POA: Diagnosis not present

## 2024-01-26 DIAGNOSIS — Z5986 Financial insecurity: Secondary | ICD-10-CM | POA: Diagnosis not present

## 2024-01-26 DIAGNOSIS — I959 Hypotension, unspecified: Secondary | ICD-10-CM | POA: Diagnosis not present

## 2024-01-26 DIAGNOSIS — R000 Tachycardia, unspecified: Secondary | ICD-10-CM | POA: Diagnosis not present

## 2024-01-26 DIAGNOSIS — E46 Unspecified protein-calorie malnutrition: Secondary | ICD-10-CM | POA: Diagnosis not present

## 2024-01-26 DIAGNOSIS — F0390 Unspecified dementia without behavioral disturbance: Secondary | ICD-10-CM | POA: Diagnosis not present

## 2024-01-26 DIAGNOSIS — Z9181 History of falling: Secondary | ICD-10-CM | POA: Diagnosis not present

## 2024-01-26 DIAGNOSIS — R1312 Dysphagia, oropharyngeal phase: Secondary | ICD-10-CM | POA: Diagnosis not present

## 2024-01-26 DIAGNOSIS — R296 Repeated falls: Secondary | ICD-10-CM | POA: Diagnosis present

## 2024-01-26 DIAGNOSIS — E8809 Other disorders of plasma-protein metabolism, not elsewhere classified: Secondary | ICD-10-CM | POA: Diagnosis present

## 2024-01-26 DIAGNOSIS — N179 Acute kidney failure, unspecified: Secondary | ICD-10-CM | POA: Diagnosis present

## 2024-01-27 DIAGNOSIS — J189 Pneumonia, unspecified organism: Secondary | ICD-10-CM | POA: Diagnosis not present

## 2024-01-27 DIAGNOSIS — R2689 Other abnormalities of gait and mobility: Secondary | ICD-10-CM | POA: Diagnosis not present

## 2024-01-27 DIAGNOSIS — Z5982 Transportation insecurity: Secondary | ICD-10-CM | POA: Diagnosis not present

## 2024-01-27 DIAGNOSIS — J09X1 Influenza due to identified novel influenza A virus with pneumonia: Secondary | ICD-10-CM | POA: Diagnosis not present

## 2024-01-27 DIAGNOSIS — Z8616 Personal history of COVID-19: Secondary | ICD-10-CM | POA: Diagnosis not present

## 2024-01-27 DIAGNOSIS — Z743 Need for continuous supervision: Secondary | ICD-10-CM | POA: Diagnosis not present

## 2024-01-27 DIAGNOSIS — E8809 Other disorders of plasma-protein metabolism, not elsewhere classified: Secondary | ICD-10-CM | POA: Diagnosis present

## 2024-01-27 DIAGNOSIS — R1312 Dysphagia, oropharyngeal phase: Secondary | ICD-10-CM | POA: Diagnosis not present

## 2024-01-27 DIAGNOSIS — Z5986 Financial insecurity: Secondary | ICD-10-CM | POA: Diagnosis not present

## 2024-01-27 DIAGNOSIS — J111 Influenza due to unidentified influenza virus with other respiratory manifestations: Secondary | ICD-10-CM | POA: Diagnosis not present

## 2024-01-27 DIAGNOSIS — F03C0 Unspecified dementia, severe, without behavioral disturbance, psychotic disturbance, mood disturbance, and anxiety: Secondary | ICD-10-CM | POA: Diagnosis present

## 2024-01-27 DIAGNOSIS — J1008 Influenza due to other identified influenza virus with other specified pneumonia: Secondary | ICD-10-CM | POA: Diagnosis present

## 2024-01-27 DIAGNOSIS — R627 Adult failure to thrive: Secondary | ICD-10-CM | POA: Diagnosis present

## 2024-01-27 DIAGNOSIS — Z91040 Latex allergy status: Secondary | ICD-10-CM | POA: Diagnosis not present

## 2024-01-27 DIAGNOSIS — N179 Acute kidney failure, unspecified: Secondary | ICD-10-CM | POA: Diagnosis present

## 2024-01-27 DIAGNOSIS — M6281 Muscle weakness (generalized): Secondary | ICD-10-CM | POA: Diagnosis not present

## 2024-01-27 DIAGNOSIS — R4182 Altered mental status, unspecified: Secondary | ICD-10-CM | POA: Diagnosis not present

## 2024-01-27 DIAGNOSIS — G9341 Metabolic encephalopathy: Secondary | ICD-10-CM | POA: Diagnosis present

## 2024-01-27 DIAGNOSIS — Z66 Do not resuscitate: Secondary | ICD-10-CM | POA: Diagnosis present

## 2024-01-27 DIAGNOSIS — R41841 Cognitive communication deficit: Secondary | ICD-10-CM | POA: Diagnosis not present

## 2024-01-27 DIAGNOSIS — R2681 Unsteadiness on feet: Secondary | ICD-10-CM | POA: Diagnosis not present

## 2024-01-27 DIAGNOSIS — Z880 Allergy status to penicillin: Secondary | ICD-10-CM | POA: Diagnosis not present

## 2024-01-27 DIAGNOSIS — J159 Unspecified bacterial pneumonia: Secondary | ICD-10-CM | POA: Diagnosis not present

## 2024-01-27 DIAGNOSIS — Z8673 Personal history of transient ischemic attack (TIA), and cerebral infarction without residual deficits: Secondary | ICD-10-CM | POA: Diagnosis not present

## 2024-01-27 DIAGNOSIS — Z9181 History of falling: Secondary | ICD-10-CM | POA: Diagnosis not present

## 2024-01-27 DIAGNOSIS — E785 Hyperlipidemia, unspecified: Secondary | ICD-10-CM | POA: Diagnosis present

## 2024-01-27 DIAGNOSIS — I10 Essential (primary) hypertension: Secondary | ICD-10-CM | POA: Diagnosis present

## 2024-01-27 DIAGNOSIS — U071 COVID-19: Secondary | ICD-10-CM | POA: Diagnosis not present

## 2024-01-27 DIAGNOSIS — E46 Unspecified protein-calorie malnutrition: Secondary | ICD-10-CM | POA: Diagnosis not present

## 2024-01-27 DIAGNOSIS — R296 Repeated falls: Secondary | ICD-10-CM | POA: Diagnosis present

## 2024-01-27 DIAGNOSIS — J019 Acute sinusitis, unspecified: Secondary | ICD-10-CM | POA: Diagnosis present

## 2024-01-27 DIAGNOSIS — F0390 Unspecified dementia without behavioral disturbance: Secondary | ICD-10-CM | POA: Diagnosis not present

## 2024-01-28 DIAGNOSIS — R4182 Altered mental status, unspecified: Secondary | ICD-10-CM | POA: Diagnosis not present

## 2024-01-28 DIAGNOSIS — R1312 Dysphagia, oropharyngeal phase: Secondary | ICD-10-CM | POA: Diagnosis not present

## 2024-01-28 DIAGNOSIS — J159 Unspecified bacterial pneumonia: Secondary | ICD-10-CM | POA: Diagnosis not present

## 2024-01-29 DIAGNOSIS — J111 Influenza due to unidentified influenza virus with other respiratory manifestations: Secondary | ICD-10-CM | POA: Diagnosis not present

## 2024-01-29 DIAGNOSIS — N179 Acute kidney failure, unspecified: Secondary | ICD-10-CM | POA: Diagnosis not present

## 2024-01-29 DIAGNOSIS — J189 Pneumonia, unspecified organism: Secondary | ICD-10-CM | POA: Diagnosis not present

## 2024-01-30 DIAGNOSIS — J189 Pneumonia, unspecified organism: Secondary | ICD-10-CM | POA: Diagnosis not present

## 2024-01-30 DIAGNOSIS — J111 Influenza due to unidentified influenza virus with other respiratory manifestations: Secondary | ICD-10-CM | POA: Diagnosis not present

## 2024-01-30 DIAGNOSIS — N179 Acute kidney failure, unspecified: Secondary | ICD-10-CM | POA: Diagnosis not present

## 2024-01-31 DIAGNOSIS — J111 Influenza due to unidentified influenza virus with other respiratory manifestations: Secondary | ICD-10-CM | POA: Diagnosis not present

## 2024-01-31 DIAGNOSIS — N179 Acute kidney failure, unspecified: Secondary | ICD-10-CM | POA: Diagnosis not present

## 2024-01-31 DIAGNOSIS — J189 Pneumonia, unspecified organism: Secondary | ICD-10-CM | POA: Diagnosis not present

## 2024-02-01 DIAGNOSIS — J09X1 Influenza due to identified novel influenza A virus with pneumonia: Secondary | ICD-10-CM | POA: Diagnosis not present

## 2024-02-01 DIAGNOSIS — F0390 Unspecified dementia without behavioral disturbance: Secondary | ICD-10-CM | POA: Diagnosis not present

## 2024-02-01 DIAGNOSIS — E46 Unspecified protein-calorie malnutrition: Secondary | ICD-10-CM | POA: Diagnosis not present

## 2024-02-01 DIAGNOSIS — R2681 Unsteadiness on feet: Secondary | ICD-10-CM | POA: Diagnosis not present

## 2024-02-01 DIAGNOSIS — R4182 Altered mental status, unspecified: Secondary | ICD-10-CM | POA: Diagnosis not present

## 2024-02-01 DIAGNOSIS — N179 Acute kidney failure, unspecified: Secondary | ICD-10-CM | POA: Diagnosis not present

## 2024-02-01 DIAGNOSIS — R2689 Other abnormalities of gait and mobility: Secondary | ICD-10-CM | POA: Diagnosis not present

## 2024-02-01 DIAGNOSIS — J189 Pneumonia, unspecified organism: Secondary | ICD-10-CM | POA: Diagnosis not present

## 2024-02-01 DIAGNOSIS — J111 Influenza due to unidentified influenza virus with other respiratory manifestations: Secondary | ICD-10-CM | POA: Diagnosis not present

## 2024-02-01 DIAGNOSIS — M6281 Muscle weakness (generalized): Secondary | ICD-10-CM | POA: Diagnosis not present

## 2024-02-01 DIAGNOSIS — U071 COVID-19: Secondary | ICD-10-CM | POA: Diagnosis not present

## 2024-02-01 DIAGNOSIS — I10 Essential (primary) hypertension: Secondary | ICD-10-CM | POA: Diagnosis not present

## 2024-02-01 DIAGNOSIS — R630 Anorexia: Secondary | ICD-10-CM | POA: Diagnosis not present

## 2024-02-01 DIAGNOSIS — Z743 Need for continuous supervision: Secondary | ICD-10-CM | POA: Diagnosis not present

## 2024-02-01 DIAGNOSIS — R41841 Cognitive communication deficit: Secondary | ICD-10-CM | POA: Diagnosis not present

## 2024-02-01 DIAGNOSIS — E785 Hyperlipidemia, unspecified: Secondary | ICD-10-CM | POA: Diagnosis not present

## 2024-02-01 DIAGNOSIS — G301 Alzheimer's disease with late onset: Secondary | ICD-10-CM | POA: Diagnosis not present

## 2024-02-01 DIAGNOSIS — Z9181 History of falling: Secondary | ICD-10-CM | POA: Diagnosis not present

## 2024-02-01 DIAGNOSIS — G9341 Metabolic encephalopathy: Secondary | ICD-10-CM | POA: Diagnosis not present

## 2024-02-01 DIAGNOSIS — F02A0 Dementia in other diseases classified elsewhere, mild, without behavioral disturbance, psychotic disturbance, mood disturbance, and anxiety: Secondary | ICD-10-CM | POA: Diagnosis not present

## 2024-02-02 DIAGNOSIS — N179 Acute kidney failure, unspecified: Secondary | ICD-10-CM | POA: Diagnosis not present

## 2024-02-02 DIAGNOSIS — F0390 Unspecified dementia without behavioral disturbance: Secondary | ICD-10-CM | POA: Diagnosis not present

## 2024-02-02 DIAGNOSIS — J09X1 Influenza due to identified novel influenza A virus with pneumonia: Secondary | ICD-10-CM | POA: Diagnosis not present

## 2024-02-02 DIAGNOSIS — E785 Hyperlipidemia, unspecified: Secondary | ICD-10-CM | POA: Diagnosis not present

## 2024-02-02 DIAGNOSIS — E46 Unspecified protein-calorie malnutrition: Secondary | ICD-10-CM | POA: Diagnosis not present

## 2024-02-02 DIAGNOSIS — I10 Essential (primary) hypertension: Secondary | ICD-10-CM | POA: Diagnosis not present

## 2024-02-07 DIAGNOSIS — J189 Pneumonia, unspecified organism: Secondary | ICD-10-CM | POA: Diagnosis not present

## 2024-03-13 DIAGNOSIS — I10 Essential (primary) hypertension: Secondary | ICD-10-CM | POA: Diagnosis not present

## 2024-03-13 DIAGNOSIS — E876 Hypokalemia: Secondary | ICD-10-CM | POA: Diagnosis not present

## 2024-03-13 DIAGNOSIS — E7800 Pure hypercholesterolemia, unspecified: Secondary | ICD-10-CM | POA: Diagnosis not present

## 2024-03-13 DIAGNOSIS — Z6824 Body mass index (BMI) 24.0-24.9, adult: Secondary | ICD-10-CM | POA: Diagnosis not present

## 2024-03-13 DIAGNOSIS — G309 Alzheimer's disease, unspecified: Secondary | ICD-10-CM | POA: Diagnosis not present

## 2024-03-17 DIAGNOSIS — Z880 Allergy status to penicillin: Secondary | ICD-10-CM | POA: Diagnosis not present

## 2024-03-17 DIAGNOSIS — R102 Pelvic and perineal pain: Secondary | ICD-10-CM | POA: Diagnosis not present

## 2024-03-17 DIAGNOSIS — Z91040 Latex allergy status: Secondary | ICD-10-CM | POA: Diagnosis not present

## 2024-03-17 DIAGNOSIS — M47816 Spondylosis without myelopathy or radiculopathy, lumbar region: Secondary | ICD-10-CM | POA: Diagnosis not present

## 2024-03-17 DIAGNOSIS — I10 Essential (primary) hypertension: Secondary | ICD-10-CM | POA: Diagnosis not present

## 2024-03-17 DIAGNOSIS — G8911 Acute pain due to trauma: Secondary | ICD-10-CM | POA: Diagnosis not present

## 2024-03-17 DIAGNOSIS — Z87891 Personal history of nicotine dependence: Secondary | ICD-10-CM | POA: Diagnosis not present

## 2024-03-17 DIAGNOSIS — W19XXXA Unspecified fall, initial encounter: Secondary | ICD-10-CM | POA: Diagnosis not present

## 2024-03-17 DIAGNOSIS — S51819A Laceration without foreign body of unspecified forearm, initial encounter: Secondary | ICD-10-CM | POA: Diagnosis not present

## 2024-03-17 DIAGNOSIS — M51369 Other intervertebral disc degeneration, lumbar region without mention of lumbar back pain or lower extremity pain: Secondary | ICD-10-CM | POA: Diagnosis not present

## 2024-03-17 DIAGNOSIS — Z66 Do not resuscitate: Secondary | ICD-10-CM | POA: Diagnosis not present

## 2024-03-17 DIAGNOSIS — Z59819 Housing instability, housed unspecified: Secondary | ICD-10-CM | POA: Diagnosis not present

## 2024-03-17 DIAGNOSIS — W1839XA Other fall on same level, initial encounter: Secondary | ICD-10-CM | POA: Diagnosis not present

## 2024-03-17 DIAGNOSIS — Z043 Encounter for examination and observation following other accident: Secondary | ICD-10-CM | POA: Diagnosis not present

## 2024-03-17 DIAGNOSIS — S51812A Laceration without foreign body of left forearm, initial encounter: Secondary | ICD-10-CM | POA: Diagnosis not present

## 2024-03-27 DIAGNOSIS — I350 Nonrheumatic aortic (valve) stenosis: Secondary | ICD-10-CM | POA: Diagnosis not present

## 2024-03-27 DIAGNOSIS — G309 Alzheimer's disease, unspecified: Secondary | ICD-10-CM | POA: Diagnosis not present

## 2024-03-27 DIAGNOSIS — Z8616 Personal history of COVID-19: Secondary | ICD-10-CM | POA: Diagnosis not present

## 2024-03-27 DIAGNOSIS — Z9181 History of falling: Secondary | ICD-10-CM | POA: Diagnosis not present

## 2024-03-27 DIAGNOSIS — I10 Essential (primary) hypertension: Secondary | ICD-10-CM | POA: Diagnosis not present

## 2024-03-27 DIAGNOSIS — F0280 Dementia in other diseases classified elsewhere without behavioral disturbance: Secondary | ICD-10-CM | POA: Diagnosis not present

## 2024-03-30 DIAGNOSIS — I350 Nonrheumatic aortic (valve) stenosis: Secondary | ICD-10-CM | POA: Diagnosis not present

## 2024-03-30 DIAGNOSIS — F0280 Dementia in other diseases classified elsewhere without behavioral disturbance: Secondary | ICD-10-CM | POA: Diagnosis not present

## 2024-03-30 DIAGNOSIS — G309 Alzheimer's disease, unspecified: Secondary | ICD-10-CM | POA: Diagnosis not present

## 2024-03-30 DIAGNOSIS — I10 Essential (primary) hypertension: Secondary | ICD-10-CM | POA: Diagnosis not present

## 2024-03-30 DIAGNOSIS — Z8616 Personal history of COVID-19: Secondary | ICD-10-CM | POA: Diagnosis not present

## 2024-03-30 DIAGNOSIS — Z9181 History of falling: Secondary | ICD-10-CM | POA: Diagnosis not present

## 2024-04-03 DIAGNOSIS — I10 Essential (primary) hypertension: Secondary | ICD-10-CM | POA: Diagnosis not present

## 2024-04-03 DIAGNOSIS — Z9181 History of falling: Secondary | ICD-10-CM | POA: Diagnosis not present

## 2024-04-03 DIAGNOSIS — F0280 Dementia in other diseases classified elsewhere without behavioral disturbance: Secondary | ICD-10-CM | POA: Diagnosis not present

## 2024-04-03 DIAGNOSIS — Z8616 Personal history of COVID-19: Secondary | ICD-10-CM | POA: Diagnosis not present

## 2024-04-03 DIAGNOSIS — G309 Alzheimer's disease, unspecified: Secondary | ICD-10-CM | POA: Diagnosis not present

## 2024-04-03 DIAGNOSIS — I350 Nonrheumatic aortic (valve) stenosis: Secondary | ICD-10-CM | POA: Diagnosis not present

## 2024-04-05 DIAGNOSIS — Z9181 History of falling: Secondary | ICD-10-CM | POA: Diagnosis not present

## 2024-04-05 DIAGNOSIS — I10 Essential (primary) hypertension: Secondary | ICD-10-CM | POA: Diagnosis not present

## 2024-04-05 DIAGNOSIS — Z8616 Personal history of COVID-19: Secondary | ICD-10-CM | POA: Diagnosis not present

## 2024-04-05 DIAGNOSIS — G309 Alzheimer's disease, unspecified: Secondary | ICD-10-CM | POA: Diagnosis not present

## 2024-04-05 DIAGNOSIS — F0280 Dementia in other diseases classified elsewhere without behavioral disturbance: Secondary | ICD-10-CM | POA: Diagnosis not present

## 2024-04-05 DIAGNOSIS — I350 Nonrheumatic aortic (valve) stenosis: Secondary | ICD-10-CM | POA: Diagnosis not present

## 2024-04-10 DIAGNOSIS — E876 Hypokalemia: Secondary | ICD-10-CM | POA: Diagnosis not present

## 2024-04-10 DIAGNOSIS — E7800 Pure hypercholesterolemia, unspecified: Secondary | ICD-10-CM | POA: Diagnosis not present

## 2024-04-10 DIAGNOSIS — I10 Essential (primary) hypertension: Secondary | ICD-10-CM | POA: Diagnosis not present

## 2024-04-10 DIAGNOSIS — Z6822 Body mass index (BMI) 22.0-22.9, adult: Secondary | ICD-10-CM | POA: Diagnosis not present

## 2024-04-10 DIAGNOSIS — G309 Alzheimer's disease, unspecified: Secondary | ICD-10-CM | POA: Diagnosis not present

## 2024-04-10 LAB — COMPREHENSIVE METABOLIC PANEL WITH GFR: EGFR (Non-African Amer.): 12.9

## 2024-04-12 DIAGNOSIS — I10 Essential (primary) hypertension: Secondary | ICD-10-CM | POA: Diagnosis not present

## 2024-04-12 DIAGNOSIS — I350 Nonrheumatic aortic (valve) stenosis: Secondary | ICD-10-CM | POA: Diagnosis not present

## 2024-04-12 DIAGNOSIS — Z9181 History of falling: Secondary | ICD-10-CM | POA: Diagnosis not present

## 2024-04-12 DIAGNOSIS — G309 Alzheimer's disease, unspecified: Secondary | ICD-10-CM | POA: Diagnosis not present

## 2024-04-12 DIAGNOSIS — Z8616 Personal history of COVID-19: Secondary | ICD-10-CM | POA: Diagnosis not present

## 2024-04-12 DIAGNOSIS — F0280 Dementia in other diseases classified elsewhere without behavioral disturbance: Secondary | ICD-10-CM | POA: Diagnosis not present

## 2024-04-13 DIAGNOSIS — I350 Nonrheumatic aortic (valve) stenosis: Secondary | ICD-10-CM | POA: Diagnosis not present

## 2024-04-13 DIAGNOSIS — I10 Essential (primary) hypertension: Secondary | ICD-10-CM | POA: Diagnosis not present

## 2024-04-13 DIAGNOSIS — G309 Alzheimer's disease, unspecified: Secondary | ICD-10-CM | POA: Diagnosis not present

## 2024-04-13 DIAGNOSIS — Z9181 History of falling: Secondary | ICD-10-CM | POA: Diagnosis not present

## 2024-04-13 DIAGNOSIS — Z8616 Personal history of COVID-19: Secondary | ICD-10-CM | POA: Diagnosis not present

## 2024-04-13 DIAGNOSIS — F0280 Dementia in other diseases classified elsewhere without behavioral disturbance: Secondary | ICD-10-CM | POA: Diagnosis not present

## 2024-04-21 DIAGNOSIS — Z8616 Personal history of COVID-19: Secondary | ICD-10-CM | POA: Diagnosis not present

## 2024-04-21 DIAGNOSIS — I10 Essential (primary) hypertension: Secondary | ICD-10-CM | POA: Diagnosis not present

## 2024-04-21 DIAGNOSIS — Z9181 History of falling: Secondary | ICD-10-CM | POA: Diagnosis not present

## 2024-04-21 DIAGNOSIS — I350 Nonrheumatic aortic (valve) stenosis: Secondary | ICD-10-CM | POA: Diagnosis not present

## 2024-04-21 DIAGNOSIS — F0280 Dementia in other diseases classified elsewhere without behavioral disturbance: Secondary | ICD-10-CM | POA: Diagnosis not present

## 2024-04-21 DIAGNOSIS — G309 Alzheimer's disease, unspecified: Secondary | ICD-10-CM | POA: Diagnosis not present

## 2024-05-18 ENCOUNTER — Ambulatory Visit: Admitting: Internal Medicine

## 2024-05-26 NOTE — Progress Notes (Signed)
 Updated medications

## 2024-05-29 ENCOUNTER — Encounter: Payer: Self-pay | Admitting: Internal Medicine

## 2024-05-30 ENCOUNTER — Ambulatory Visit: Attending: Internal Medicine | Admitting: Internal Medicine

## 2024-05-30 VITALS — BP 130/68 | HR 71 | Ht 64.0 in | Wt 126.6 lb

## 2024-05-30 DIAGNOSIS — I350 Nonrheumatic aortic (valve) stenosis: Secondary | ICD-10-CM | POA: Insufficient documentation

## 2024-05-30 DIAGNOSIS — E7849 Other hyperlipidemia: Secondary | ICD-10-CM | POA: Insufficient documentation

## 2024-05-30 DIAGNOSIS — I10 Essential (primary) hypertension: Secondary | ICD-10-CM | POA: Insufficient documentation

## 2024-05-30 DIAGNOSIS — E785 Hyperlipidemia, unspecified: Secondary | ICD-10-CM | POA: Insufficient documentation

## 2024-05-30 DIAGNOSIS — Z136 Encounter for screening for cardiovascular disorders: Secondary | ICD-10-CM | POA: Diagnosis not present

## 2024-05-30 NOTE — Patient Instructions (Addendum)
 Medication Instructions:  Your physician recommends that you continue on your current medications as directed. Please refer to the Current Medication list given to you today.   Labwork: None  Testing/Procedures: Your physician has requested that you have an echocardiogram. Echocardiography is a painless test that uses sound waves to create images of your heart. It provides your doctor with information about the size and shape of your heart and how well your heart's chambers and valves are working. This procedure takes approximately one hour. There are no restrictions for this procedure. Please do NOT wear cologne, perfume, aftershave, or lotions (deodorant is allowed). Please arrive 15 minutes prior to your appointment time.  Please note: We ask at that you not bring children with you during ultrasound (echo/ vascular) testing. Due to room size and safety concerns, children are not allowed in the ultrasound rooms during exams. Our front office staff cannot provide observation of children in our lobby area while testing is being conducted. An adult accompanying a patient to their appointment will only be allowed in the ultrasound room at the discretion of the ultrasound technician under special circumstances. We apologize for any inconvenience.   Follow-Up: Your physician recommends that you schedule a follow-up appointment in: 3 months  Any Other Special Instructions Will Be Listed Below (If Applicable).  Left and Right Heart Catheterization & Transcatheter Aortic Valve Replacement   Thank you for choosing [facility]!     If you need a refill on your cardiac medications before your next appointment, please call your pharmacy.

## 2024-05-30 NOTE — Progress Notes (Signed)
 Cardiology Office Note  Date: 05/30/2024   ID: Karen Small, Karen Small [birth year], MRN [id number]  PCP:  Lauran Pollard, MD  Cardiologist:  Lasalle Pointer, MD Electrophysiologist:  None   History of Present Illness: Karen Small is a 89 y.o. female known to have HTN, moderate to severe aortic valve stenosis in 2024 was referred to cardiology clinic to establish care and for the management of aortic valve stenosis.  Accompanied by son.  I reviewed the records from PCP and echocardiogram in 2024.  Echocardiogram in 2024 at [facility] showed normal LVEF, G1 DD, mild AR, moderate to severe aortic valve stenosis (significant progression from echo performed in 2021).  Patient had multiple hospitalizations previously for UTI, fall etc. eventually placed in rehab facility.  Currently living in [place].  She has been feeling dizzy for the last couple of months.  Occurs sometimes.  No syncope.  No chest pain or SOB.  She has a history of Alzheimer's dementia.  According to the son, it is becoming more apparent now.  Past Medical History:  Diagnosis Date   Hypertension     Past Surgical History:  Procedure Laterality Date   CHOLECYSTECTOMY      Current Outpatient Medications  Medication Sig Dispense Refill   atorvastatin (LIPITOR) 10 MG tablet Take 10 mg by mouth daily.     mirtazapine (REMERON) 15 MG tablet Take 7.5 mg by mouth daily.     Potassium Chloride ER 20 MEQ TBCR Take 1 tablet by mouth daily.     chlorthalidone (HYGROTON) 25 MG tablet Take 25 mg by mouth daily.     cyclobenzaprine  (FLEXERIL ) 10 MG tablet Take 0.5 tablets (5 mg total) by mouth 2 (two) times daily as needed for muscle spasms. 20 tablet 0   losartan (COZAAR) 50 MG tablet Take 1 tablet by mouth daily.     potassium chloride SA (KLOR-CON M) 20 MEQ tablet Take 20 mEq by mouth daily. (Patient not taking: Reported on 05/30/2024)     PRESCRIPTION MEDICATION Take 1 tablet by mouth daily. Cholesterol medications (STATIN)      traMADol  (ULTRAM ) 50 MG tablet Take 1 tablet (50 mg total) by mouth every 6 (six) hours as needed. 15 tablet 0   No current facility-administered medications for this visit.   Allergies:  Latex, Neosporin [neomycin-bacitracin zn-polymyx], Niacin and related, Nickel, and Penicillins   Social History: The patient  reports that she has never smoked. She has never used smokeless tobacco. She reports that she does not drink alcohol .   Family History: The patient's family history is not on file.   ROS:  Please see the history of present illness. Otherwise, complete review of systems is positive for none  All other systems are reviewed and negative.   Physical Exam: VS:  BP 130/68   Pulse 71   Ht 5\' 4"  (1.626 m)   Wt 126 lb 9.6 oz (57.4 kg)   SpO2 96%   BMI 21.73 kg/m , BMI Body mass index is 21.73 kg/m.  Wt Readings from Last 3 Encounters:  05/30/24 126 lb 9.6 oz (57.4 kg)  01/25/17 152 lb (68.9 kg)  03/28/14 152 lb (68.9 kg)    General: Patient appears comfortable at rest. HEENT: Conjunctiva and lids normal, oropharynx clear with moist mucosa. Neck: Supple, no elevated JVP or carotid bruits, no thyromegaly. Lungs: Clear to auscultation, nonlabored breathing at rest. Cardiac: Regular rate and rhythm, ESM with faint S2 Abdomen: Soft, nontender, no hepatomegaly, bowel sounds  present, no guarding or rebound. Extremities: No pitting edema, distal pulses 2+. Skin: Warm and dry. Musculoskeletal: No kyphosis. Neuropsychiatric: Alert and oriented x1, time,  Recent Labwork: No results found for requested labs within last 365 days.  No results found for: "CHOL", "TRIG", "HDL", "CHOLHDL", "VLDL", "LDLCALC", "LDLDIRECT"   Assessment and Plan:  Moderate to severe aortic valve stenosis: Symptomatic with dizziness. Echocardiogram in February 2024 at [facility] showed moderate to severe aortic stenosis with V-max 3.6 m/s, mean PG 29 mmHg, estimated aortic valve area 1 cm and DVI 0.35.  LVOT  stroke-volume index was 41 mL/m.  Repeat echocardiogram.  I discussed the pathophysiology, symptoms and the management of severe atrial stenosis.  Patient is not alert or oriented to place or person although she did appear to have partial insight into her medical condition.  She refused any invasive procedures at this time as she does not think she needs it.  However if her condition deteriorates in the future, she is willing to undergo the procedures.  Her son is the power of attorney.  Will revisit the conversation in 3 months and reevaluate her symptoms.  HTN, controlled: Continue chlorthalidone 25 mg once daily and losartan 50 mg once daily.  Follows with PCP.  HLD, known values: Continue atorvastatin 10 mg nightly, goal LDL less than 100.       Medication Adjustments/Labs and Tests Ordered: Current medicines are reviewed at length with the patient today.  Concerns regarding medicines are outlined above.    Disposition:  Follow up 3 months  Signed Kamon Fahr Priya Lory Galan, MD, 05/30/2024 1:46 PM    [facility] [address] [place], [place], [place] [address]

## 2024-06-05 ENCOUNTER — Ambulatory Visit: Attending: Internal Medicine

## 2024-06-05 DIAGNOSIS — I350 Nonrheumatic aortic (valve) stenosis: Secondary | ICD-10-CM | POA: Insufficient documentation

## 2024-06-06 LAB — ECHOCARDIOGRAM COMPLETE
AR max vel: 0.84 cm2
AV Area VTI: 0.7 cm2
AV Area mean vel: 0.73 cm2
AV Mean grad: 41 mmHg
AV Peak grad: 70.1 mmHg
Ao pk vel: 4.19 m/s
Area-P 1/2: 2.41 cm2
Calc EF: 68.2 %
MV VTI: 2.22 cm2
P 1/2 time: 473 ms
S' Lateral: 2.4 cm
Single Plane A2C EF: 64.4 %
Single Plane A4C EF: 69.4 %

## 2024-06-09 ENCOUNTER — Ambulatory Visit: Payer: Self-pay | Admitting: Internal Medicine

## 2024-06-27 ENCOUNTER — Encounter: Payer: Self-pay | Admitting: Internal Medicine

## 2024-06-27 NOTE — Telephone Encounter (Signed)
 error

## 2024-08-30 ENCOUNTER — Encounter: Payer: Self-pay | Admitting: Internal Medicine

## 2024-08-30 ENCOUNTER — Ambulatory Visit: Attending: Internal Medicine | Admitting: Internal Medicine

## 2024-08-30 VITALS — BP 114/62 | HR 98 | Ht 64.0 in | Wt 124.2 lb

## 2024-08-30 DIAGNOSIS — R0602 Shortness of breath: Secondary | ICD-10-CM | POA: Diagnosis not present

## 2024-08-30 DIAGNOSIS — I350 Nonrheumatic aortic (valve) stenosis: Secondary | ICD-10-CM | POA: Insufficient documentation

## 2024-08-30 DIAGNOSIS — J440 Chronic obstructive pulmonary disease with acute lower respiratory infection: Secondary | ICD-10-CM | POA: Diagnosis not present

## 2024-08-30 NOTE — Progress Notes (Signed)
 Cardiology Office Note  Date: 08/30/2024   ID: Karen Small, DOB [birth year], MRN [id number]  PCP:  Trudy Vaughn FALCON, MD  Cardiologist:  Diannah SHAUNNA Maywood, MD Electrophysiologist:  None   History of Present Illness: Karen Small is a 89 y.o. female known to have dementia, HTN, severe aortic valve stenosis and mild to moderate aortic regurgitation with normal LVEF in 2025 is here for follow-up visit.  Accompanied by 2 sons.  She lives in an assisted living facility.  Patient had multiple hospitalizations previously for UTI, fall etc. eventually placed in rehab facility.  Currently living in [place].   Asymptomatic, has positional dizziness but no exertional dizziness.  No syncope.  No chest pain or DOE.  No leg swelling.  She does not have any insight into her medical condition.  She has dementia but not severe.  According to her son, she has forgetfulness.  Past Medical History:  Diagnosis Date   Hypertension     Past Surgical History:  Procedure Laterality Date   CHOLECYSTECTOMY      Current Outpatient Medications  Medication Sig Dispense Refill   atorvastatin (LIPITOR) 10 MG tablet Take 10 mg by mouth daily.     chlorthalidone (HYGROTON) 25 MG tablet Take 25 mg by mouth daily.     cyclobenzaprine  (FLEXERIL ) 10 MG tablet Take 0.5 tablets (5 mg total) by mouth 2 (two) times daily as needed for muscle spasms. 20 tablet 0   losartan (COZAAR) 50 MG tablet Take 1 tablet by mouth daily.     mirtazapine (REMERON) 15 MG tablet Take 7.5 mg by mouth daily.     Potassium Chloride ER 20 MEQ TBCR Take 1 tablet by mouth daily.     potassium chloride SA (KLOR-CON M) 20 MEQ tablet Take 20 mEq by mouth daily.     PRESCRIPTION MEDICATION Take 1 tablet by mouth daily. Cholesterol medications (STATIN)     traMADol  (ULTRAM ) 50 MG tablet Take 1 tablet (50 mg total) by mouth every 6 (six) hours as needed. 15 tablet 0   No current facility-administered medications for this visit.    Allergies:  Latex, Neosporin [neomycin-bacitracin zn-polymyx], Niacin and related, Nickel, and Penicillins   Social History: The patient  reports that she has never smoked. She has never used smokeless tobacco. She reports that she does not drink alcohol .   Family History: The patient's family history is not on file.   ROS:  Please see the history of present illness. Otherwise, complete review of systems is positive for none  All other systems are reviewed and negative.   Physical Exam: VS:  BP 114/62   Pulse 98   Ht 5' 4 (1.626 m)   Wt 124 lb 3.2 oz (56.3 kg)   SpO2 98%   BMI 21.32 kg/m , BMI Body mass index is 21.32 kg/m.  Wt Readings from Last 3 Encounters:  08/30/24 124 lb 3.2 oz (56.3 kg)  05/30/24 126 lb 9.6 oz (57.4 kg)  01/25/17 152 lb (68.9 kg)    Small: Patient appears comfortable at rest. HEENT: Conjunctiva and lids normal, oropharynx clear with moist mucosa. Neck: Supple, no elevated JVP or carotid bruits, no thyromegaly. Lungs: Clear to auscultation, nonlabored breathing at rest. Cardiac: Regular rate and rhythm, ESM with faint S2 Abdomen: Soft, nontender, no hepatomegaly, bowel sounds present, no guarding or rebound. Extremities: No pitting edema, distal pulses 2+. Skin: Warm and dry. Musculoskeletal: No kyphosis. Neuropsychiatric: Alert and oriented x1, time,  Recent Labwork: No  results found for requested labs within last 365 days.  No results found for: CHOL, TRIG, HDL, CHOLHDL, VLDL, LDLCALC, LDLDIRECT   Assessment and Plan:  Severe AS and mild to moderate AR: Asymptomatic.  She has positional dizziness but not exertional dizziness.  She also has vertigo.  No angina, DOE, exertional dizziness, syncope.  Repeat echocardiogram in December 25.  Patient has a diagnosis of dementia (but not severe) and does not have any insight into her medical condition.  She is DNR according to the Will and advance directives.  As she has been asymptomatic,  we will repeat echocardiogram every 6 months. Obtain BNP.  When she becomes symptomatic in the future, will need to revisit the conversation about invasive procedures.  But I do not think she has insight into her medical condition or can provide any consent.  HTN, controlled: Continue chlorthalidone 25 mg once daily, losartan 50 mg once daily.  HLD, known values: Continue atorvastatin 10 mg nightly, goal LDL less than 100.   30 minutes spent with the patient and her family (including 2 sons).  I answered all the questions.  5 to 10 minutes spent in documentation.  Medication Adjustments/Labs and Tests Ordered: Current medicines are reviewed at length with the patient today.  Concerns regarding medicines are outlined above.    Disposition:  Follow up 6 months  Signed Hubert Raatz Priya Temeca Somma, MD, 08/30/2024 10:52 AM    [facility] [address] [place], [place], [place] [address]

## 2024-08-30 NOTE — Patient Instructions (Addendum)
 Medication Instructions:  Your physician recommends that you continue on your current medications as directed. Please refer to the Current Medication list given to you today.   Labwork: BNP to be completed today at [facility]  Testing/Procedures: Your physician has requested that you have an echocardiogram in December. Echocardiography is a painless test that uses sound waves to create images of your heart. It provides your doctor with information about the size and shape of your heart and how well your heart's chambers and valves are working. This procedure takes approximately one hour. There are no restrictions for this procedure. Please do NOT wear cologne, perfume, aftershave, or lotions (deodorant is allowed). Please arrive 15 minutes prior to your appointment time.  Please note: We ask at that you not bring children with you during ultrasound (echo/ vascular) testing. Due to room size and safety concerns, children are not allowed in the ultrasound rooms during exams. Our front office staff cannot provide observation of children in our lobby area while testing is being conducted. An adult accompanying a patient to their appointment will only be allowed in the ultrasound room at the discretion of the ultrasound technician under special circumstances. We apologize for any inconvenience.   Follow-Up: Your physician recommends that you schedule a follow-up appointment in: 6 months  Any Other Special Instructions Will Be Listed Below (If Applicable). Thank you for choosing [facility]!     If you need a refill on your cardiac medications before your next appointment, please call your pharmacy.

## 2024-09-25 DIAGNOSIS — I10 Essential (primary) hypertension: Secondary | ICD-10-CM | POA: Diagnosis not present

## 2024-09-25 DIAGNOSIS — R000 Tachycardia, unspecified: Secondary | ICD-10-CM | POA: Diagnosis not present

## 2024-09-25 DIAGNOSIS — R4182 Altered mental status, unspecified: Secondary | ICD-10-CM | POA: Diagnosis not present

## 2024-10-04 DIAGNOSIS — Z23 Encounter for immunization: Secondary | ICD-10-CM | POA: Diagnosis not present

## 2024-10-04 DIAGNOSIS — Z1329 Encounter for screening for other suspected endocrine disorder: Secondary | ICD-10-CM | POA: Diagnosis not present

## 2024-10-04 DIAGNOSIS — E876 Hypokalemia: Secondary | ICD-10-CM | POA: Diagnosis not present

## 2024-10-04 DIAGNOSIS — R7989 Other specified abnormal findings of blood chemistry: Secondary | ICD-10-CM | POA: Diagnosis not present

## 2024-10-11 DIAGNOSIS — I350 Nonrheumatic aortic (valve) stenosis: Secondary | ICD-10-CM | POA: Diagnosis not present

## 2024-10-11 DIAGNOSIS — I83893 Varicose veins of bilateral lower extremities with other complications: Secondary | ICD-10-CM | POA: Diagnosis not present

## 2024-10-11 DIAGNOSIS — Z1389 Encounter for screening for other disorder: Secondary | ICD-10-CM | POA: Diagnosis not present

## 2024-10-11 DIAGNOSIS — I739 Peripheral vascular disease, unspecified: Secondary | ICD-10-CM | POA: Diagnosis not present

## 2024-10-11 DIAGNOSIS — M79671 Pain in right foot: Secondary | ICD-10-CM | POA: Diagnosis not present

## 2024-10-11 DIAGNOSIS — I10 Essential (primary) hypertension: Secondary | ICD-10-CM | POA: Diagnosis not present

## 2024-10-11 DIAGNOSIS — M79672 Pain in left foot: Secondary | ICD-10-CM | POA: Diagnosis not present

## 2024-10-11 DIAGNOSIS — I872 Venous insufficiency (chronic) (peripheral): Secondary | ICD-10-CM | POA: Diagnosis not present

## 2024-10-11 DIAGNOSIS — E876 Hypokalemia: Secondary | ICD-10-CM | POA: Diagnosis not present

## 2024-10-11 DIAGNOSIS — Z0001 Encounter for general adult medical examination with abnormal findings: Secondary | ICD-10-CM | POA: Diagnosis not present

## 2024-10-11 DIAGNOSIS — Z6821 Body mass index (BMI) 21.0-21.9, adult: Secondary | ICD-10-CM | POA: Diagnosis not present

## 2024-10-11 DIAGNOSIS — Z2911 Encounter for prophylactic immunotherapy for respiratory syncytial virus (RSV): Secondary | ICD-10-CM | POA: Diagnosis not present

## 2024-10-11 DIAGNOSIS — B351 Tinea unguium: Secondary | ICD-10-CM | POA: Diagnosis not present

## 2024-10-11 DIAGNOSIS — L608 Other nail disorders: Secondary | ICD-10-CM | POA: Diagnosis not present

## 2024-10-11 DIAGNOSIS — L600 Ingrowing nail: Secondary | ICD-10-CM | POA: Diagnosis not present

## 2024-10-11 DIAGNOSIS — Z1331 Encounter for screening for depression: Secondary | ICD-10-CM | POA: Diagnosis not present

## 2024-10-11 DIAGNOSIS — Q667 Congenital pes cavus, unspecified foot: Secondary | ICD-10-CM | POA: Diagnosis not present

## 2024-10-11 DIAGNOSIS — Z23 Encounter for immunization: Secondary | ICD-10-CM | POA: Diagnosis not present

## 2024-10-11 DIAGNOSIS — E7800 Pure hypercholesterolemia, unspecified: Secondary | ICD-10-CM | POA: Diagnosis not present

## 2024-10-11 DIAGNOSIS — L84 Corns and callosities: Secondary | ICD-10-CM | POA: Diagnosis not present

## 2024-10-11 DIAGNOSIS — M2010 Hallux valgus (acquired), unspecified foot: Secondary | ICD-10-CM | POA: Diagnosis not present

## 2024-12-01 DIAGNOSIS — N1831 Chronic kidney disease, stage 3a: Secondary | ICD-10-CM | POA: Diagnosis not present

## 2024-12-01 DIAGNOSIS — Z1329 Encounter for screening for other suspected endocrine disorder: Secondary | ICD-10-CM | POA: Diagnosis not present

## 2024-12-01 DIAGNOSIS — E876 Hypokalemia: Secondary | ICD-10-CM | POA: Diagnosis not present

## 2024-12-01 DIAGNOSIS — R7989 Other specified abnormal findings of blood chemistry: Secondary | ICD-10-CM | POA: Diagnosis not present

## 2024-12-05 ENCOUNTER — Other Ambulatory Visit: Attending: Internal Medicine

## 2024-12-05 DIAGNOSIS — I350 Nonrheumatic aortic (valve) stenosis: Secondary | ICD-10-CM | POA: Diagnosis present

## 2024-12-05 LAB — ECHOCARDIOGRAM COMPLETE
AR max vel: 0.62 cm2
AV Area VTI: 0.63 cm2
AV Area mean vel: 0.64 cm2
AV Mean grad: 48 mmHg
AV Peak grad: 81.4 mmHg
AV Vena cont: 0.4 cm
Ao pk vel: 4.51 m/s
Area-P 1/2: 2.64 cm2
Calc EF: 75.1 %
MV VTI: 2.38 cm2
P 1/2 time: 467 ms
S' Lateral: 2.7 cm
Single Plane A2C EF: 76.8 %
Single Plane A4C EF: 70 %

## 2024-12-07 ENCOUNTER — Ambulatory Visit: Payer: Self-pay | Admitting: Internal Medicine

## 2024-12-08 NOTE — Telephone Encounter (Signed)
-----   Message from Vishnu Mallipeddi, MD sent at 12/07/2024  1:06 PM EST ----- Normal LV function, G1 DD, normal RV function, mild MR, mild AR, severe AS, CVP 3 mmHg.  Findings unchanged compared to prior echocardiogram.

## 2024-12-08 NOTE — Telephone Encounter (Signed)
 The patient has been notified of the result and verbalized understanding.  All questions (if any) were answered. Littie CHRISTELLA Croak, CMA 12/08/2024 4:12 PM

## 2025-03-27 ENCOUNTER — Ambulatory Visit: Admitting: Internal Medicine
